# Patient Record
Sex: Female | Born: 1950 | Race: White | Hispanic: No | Marital: Married | Smoking: Never smoker
Health system: Southern US, Community
[De-identification: ages and names within clinical notes are randomized; demographics above are authoritative.]

## PROBLEM LIST (undated history)

## (undated) DIAGNOSIS — E079 Disorder of thyroid, unspecified: Secondary | ICD-10-CM

## (undated) DIAGNOSIS — I10 Essential (primary) hypertension: Secondary | ICD-10-CM

## (undated) HISTORY — PX: ELBOW SURGERY: SHX618

## (undated) HISTORY — PX: CHOLECYSTECTOMY: SHX55

## (undated) HISTORY — PX: ABDOMINAL HYSTERECTOMY: SHX81

---

## 1998-05-23 ENCOUNTER — Ambulatory Visit (HOSPITAL_COMMUNITY): Admission: RE | Admit: 1998-05-23 | Discharge: 1998-05-23 | Payer: Self-pay | Admitting: Obstetrics and Gynecology

## 1998-10-08 ENCOUNTER — Ambulatory Visit (HOSPITAL_COMMUNITY): Admission: RE | Admit: 1998-10-08 | Discharge: 1998-10-08 | Payer: Self-pay | Admitting: Obstetrics and Gynecology

## 1998-10-08 ENCOUNTER — Encounter: Payer: Self-pay | Admitting: Obstetrics and Gynecology

## 1999-06-06 ENCOUNTER — Encounter: Payer: Self-pay | Admitting: Obstetrics and Gynecology

## 1999-06-06 ENCOUNTER — Ambulatory Visit (HOSPITAL_COMMUNITY): Admission: RE | Admit: 1999-06-06 | Discharge: 1999-06-06 | Payer: Self-pay | Admitting: Obstetrics and Gynecology

## 1999-12-02 ENCOUNTER — Other Ambulatory Visit: Admission: RE | Admit: 1999-12-02 | Discharge: 1999-12-02 | Payer: Self-pay | Admitting: Obstetrics and Gynecology

## 2000-06-02 ENCOUNTER — Encounter: Admission: RE | Admit: 2000-06-02 | Discharge: 2000-06-02 | Payer: Self-pay | Admitting: Family Medicine

## 2000-06-02 ENCOUNTER — Encounter: Payer: Self-pay | Admitting: Family Medicine

## 2000-06-07 ENCOUNTER — Encounter: Payer: Self-pay | Admitting: Obstetrics and Gynecology

## 2000-06-07 ENCOUNTER — Ambulatory Visit (HOSPITAL_COMMUNITY): Admission: RE | Admit: 2000-06-07 | Discharge: 2000-06-07 | Payer: Self-pay | Admitting: Obstetrics and Gynecology

## 2000-10-20 ENCOUNTER — Encounter: Payer: Self-pay | Admitting: Family Medicine

## 2000-10-20 ENCOUNTER — Encounter: Admission: RE | Admit: 2000-10-20 | Discharge: 2000-10-20 | Payer: Self-pay | Admitting: Family Medicine

## 2001-07-12 ENCOUNTER — Encounter: Payer: Self-pay | Admitting: Obstetrics and Gynecology

## 2001-07-12 ENCOUNTER — Ambulatory Visit (HOSPITAL_COMMUNITY): Admission: RE | Admit: 2001-07-12 | Discharge: 2001-07-12 | Payer: Self-pay | Admitting: Obstetrics and Gynecology

## 2002-09-08 ENCOUNTER — Ambulatory Visit (HOSPITAL_COMMUNITY): Admission: RE | Admit: 2002-09-08 | Discharge: 2002-09-08 | Payer: Self-pay | Admitting: Obstetrics and Gynecology

## 2002-09-08 ENCOUNTER — Encounter: Payer: Self-pay | Admitting: Obstetrics and Gynecology

## 2003-09-27 ENCOUNTER — Ambulatory Visit (HOSPITAL_COMMUNITY): Admission: RE | Admit: 2003-09-27 | Discharge: 2003-09-27 | Payer: Self-pay | Admitting: Obstetrics and Gynecology

## 2003-09-27 ENCOUNTER — Encounter: Payer: Self-pay | Admitting: Obstetrics and Gynecology

## 2004-01-07 ENCOUNTER — Other Ambulatory Visit: Admission: RE | Admit: 2004-01-07 | Discharge: 2004-01-07 | Payer: Self-pay | Admitting: Obstetrics and Gynecology

## 2004-10-20 ENCOUNTER — Ambulatory Visit (HOSPITAL_COMMUNITY): Admission: RE | Admit: 2004-10-20 | Discharge: 2004-10-20 | Payer: Self-pay | Admitting: Obstetrics and Gynecology

## 2005-11-03 ENCOUNTER — Ambulatory Visit (HOSPITAL_COMMUNITY): Admission: RE | Admit: 2005-11-03 | Discharge: 2005-11-03 | Payer: Self-pay | Admitting: Obstetrics and Gynecology

## 2006-08-03 ENCOUNTER — Emergency Department (HOSPITAL_COMMUNITY): Admission: EM | Admit: 2006-08-03 | Discharge: 2006-08-03 | Payer: Self-pay | Admitting: Emergency Medicine

## 2006-11-09 ENCOUNTER — Ambulatory Visit (HOSPITAL_COMMUNITY): Admission: RE | Admit: 2006-11-09 | Discharge: 2006-11-09 | Payer: Self-pay | Admitting: Obstetrics and Gynecology

## 2007-12-20 ENCOUNTER — Ambulatory Visit (HOSPITAL_COMMUNITY): Admission: RE | Admit: 2007-12-20 | Discharge: 2007-12-20 | Payer: Self-pay | Admitting: Obstetrics and Gynecology

## 2008-12-28 ENCOUNTER — Ambulatory Visit (HOSPITAL_COMMUNITY): Admission: RE | Admit: 2008-12-28 | Discharge: 2008-12-28 | Payer: Self-pay | Admitting: Obstetrics and Gynecology

## 2010-01-07 ENCOUNTER — Ambulatory Visit (HOSPITAL_COMMUNITY): Admission: RE | Admit: 2010-01-07 | Discharge: 2010-01-07 | Payer: Self-pay | Admitting: Obstetrics and Gynecology

## 2010-10-03 ENCOUNTER — Emergency Department (HOSPITAL_COMMUNITY): Admission: EM | Admit: 2010-10-03 | Discharge: 2010-10-03 | Payer: Self-pay | Admitting: Family Medicine

## 2011-01-04 ENCOUNTER — Encounter: Payer: Self-pay | Admitting: Obstetrics and Gynecology

## 2011-03-23 ENCOUNTER — Other Ambulatory Visit (HOSPITAL_COMMUNITY): Payer: Self-pay | Admitting: Obstetrics and Gynecology

## 2011-03-23 DIAGNOSIS — Z1231 Encounter for screening mammogram for malignant neoplasm of breast: Secondary | ICD-10-CM

## 2011-04-01 ENCOUNTER — Ambulatory Visit (HOSPITAL_COMMUNITY)
Admission: RE | Admit: 2011-04-01 | Discharge: 2011-04-01 | Disposition: A | Discharge status: Home / Self Care | Payer: 59 | Source: Ambulatory Visit | Attending: Obstetrics and Gynecology | Admitting: Obstetrics and Gynecology

## 2011-04-01 DIAGNOSIS — Z1231 Encounter for screening mammogram for malignant neoplasm of breast: Secondary | ICD-10-CM | POA: Insufficient documentation

## 2012-04-22 ENCOUNTER — Other Ambulatory Visit (HOSPITAL_COMMUNITY): Payer: Self-pay | Admitting: Obstetrics and Gynecology

## 2012-04-22 DIAGNOSIS — Z1231 Encounter for screening mammogram for malignant neoplasm of breast: Secondary | ICD-10-CM

## 2012-05-12 ENCOUNTER — Encounter: Payer: Self-pay | Admitting: Emergency Medicine

## 2012-05-12 ENCOUNTER — Emergency Department
Admission: EM | Admit: 2012-05-12 | Discharge: 2012-05-12 | Disposition: A | Discharge status: Home / Self Care | Payer: 59 | Source: Home / Self Care

## 2012-05-12 DIAGNOSIS — J309 Allergic rhinitis, unspecified: Secondary | ICD-10-CM

## 2012-05-12 HISTORY — DX: Essential (primary) hypertension: I10

## 2012-05-12 MED ORDER — FLUTICASONE PROPIONATE 50 MCG/ACT NA SUSP: NASAL | Status: AC

## 2012-05-12 NOTE — ED Provider Notes (Signed)
History     CSN: [id number]  Arrival date & time 05/12/12  1050   First MD Initiated Contact with Patient 05/12/12 1053      Chief Complaint  Patient presents with  . Sinus Problem    (Consider location/radiation/quality/duration/timing/severity/associated sxs/prior treatment) HPI Comments: URI Symptoms Onset: 4-5 days  Description: baseline allergic issues over last 2-3 months. Has had rhinorrhea, nasal congestion, post nasal drip, cough  Modifying factors:  See above   Symptoms Nasal discharge: yes Fever: no Sore throat: no Cough: mild Wheezing: no Ear pain: no GI symptoms: no Sick contacts: no  Red Flags  Stiff neck: no Dyspnea: no Rash: no Swallowing difficulty: no  Sinusitis Risk Factors Headache/face pain: mild Double sickening: no tooth pain: no  Allergy Risk Factors Sneezing: yes Itchy scratchy throat: yes Seasonal symptoms: yes  Flu Risk Factors Headache: no muscle aches: no severe fatigue: no     Patient is a 61 y.o. female presenting with sinus complaint. The history is provided by the patient.  Sinus Problem    Past Medical History  Diagnosis Date  . Hypertension     Past Surgical History  Procedure Date  . Cholecystectomy   . Abdominal hysterectomy     No family history on file.  History  Substance Use Topics  . Smoking status: Not on file  . Smokeless tobacco: Not on file  . Alcohol Use: Yes    OB History    Grav Para Term Preterm Abortions TAB SAB Ect Mult Living                  Review of Systems  Allergies  Penicillins and Sulfa antibiotics  Home Medications   Current Outpatient Rx  Name Route Sig Dispense Refill  . ASPIRIN 81 MG PO CHEW Oral Chew 81 mg by mouth daily.    Marland Kitchen ESTROGENS CONJ SYNTHETIC A 0.3 MG PO TABS Oral Take 0.3 mg by mouth daily.    Marland Kitchen MONTELUKAST SODIUM 10 MG PO TABS Oral Take 10 mg by mouth at bedtime.    Marland Kitchen VALSARTAN-HYDROCHLOROTHIAZIDE 320-12.5 MG PO TABS Oral Take 1 tablet by mouth  daily.    Marland Kitchen FLUTICASONE PROPIONATE 50 MCG/ACT NA SUSP  2 sprays into each nostril daily 16 g 2    BP 133/83  Pulse 76  Temp(Src) 98.8 F (37.1 C) (Oral)  Resp 16  Ht 5\' 3"  (1.6 m)  Wt 149 lb (67.586 kg)  BMI 26.39 kg/m2  SpO2 95%  Physical Exam  Constitutional: She appears well-developed and well-nourished.  HENT:  Head: Normocephalic and atraumatic.  Right Ear: External ear normal.  Left Ear: External ear normal.       +nasal erythema, rhinorrhea bilaterally, + post oropharyngeal erythema    Eyes: Conjunctivae are normal. Pupils are equal, round, and reactive to light.  Neck: Normal range of motion. Neck supple.  Cardiovascular: Normal rate and regular rhythm.   Pulmonary/Chest: Effort normal and breath sounds normal. No respiratory distress. She has no wheezes. She has no rales.  Abdominal: Soft. Bowel sounds are normal.  Lymphadenopathy:    She has no cervical adenopathy.  Neurological: She is alert.  Skin: Skin is warm.    ED Course  Procedures (including critical care time)  Labs Reviewed - No data to display No results found.   No diagnosis found.    MDM  Clinically with allergic rhinitis. Will add flonase to regimen of claritin and singulair. Discussed resp red flags. Follow up as needed. Handout  given.     The patient and/or caregiver has been counseled thoroughly with regard to treatment plan and/or medications prescribed including dosage, schedule, interactions, rationale for use, and possible side effects and they verbalize understanding. Diagnoses and expected course of recovery discussed and will return if not improved as expected or if the condition worsens. Patient and/or caregiver verbalized understanding.             Floydene Flock, MD 05/12/12 [phone]

## 2012-05-12 NOTE — ED Provider Notes (Signed)
Pt seen by Dr. Alvester Morin for allergic rhinitis / viral URI.  Marlaine Hind, MD 05/12/12 [phone]

## 2012-05-12 NOTE — ED Notes (Signed)
Congestion, sneezing, cough, pressure, headache x 5 days

## 2012-05-12 NOTE — Discharge Instructions (Signed)
Allergic Rhinitis  Allergic rhinitis is when the mucous membranes in the nose respond to allergens. Allergens are particles in the air that cause your body to have an allergic reaction. This causes you to release allergic antibodies. Through a chain of events, these eventually cause you to release histamine into the blood stream (hence the use of antihistamines). Although meant to be protective to the body, it is this release that causes your discomfort, such as frequent sneezing, congestion and an itchy runny nose.    CAUSES    The pollen allergens may come from grasses, trees, and weeds. This is seasonal allergic rhinitis, or "hay fever." Other allergens cause year-round allergic rhinitis (perennial allergic rhinitis) such as house dust mite allergen, pet dander and mold spores.    SYMPTOMS     Nasal stuffiness (congestion).   Runny, itchy nose with sneezing and tearing of the eyes.   There is often an itching of the mouth, eyes and ears.  It cannot be cured, but it can be controlled with medications.  DIAGNOSIS    If you are unable to determine the offending allergen, skin or blood testing may find it.  TREATMENT     Avoid the allergen.   Medications and allergy shots (immunotherapy) can help.   Hay fever may often be treated with antihistamines in pill or nasal spray forms. Antihistamines block the effects of histamine. There are over-the-counter medicines that may help with nasal congestion and swelling around the eyes. Check with your caregiver before taking or giving this medicine.  If the treatment above does not work, there are many new medications your caregiver can prescribe. Stronger medications may be used if initial measures are ineffective. Desensitizing injections can be used if medications and avoidance fails. Desensitization is when a patient is given ongoing shots until the body becomes less sensitive to the allergen. Make sure you follow up with your caregiver if problems continue.  SEEK  MEDICAL CARE IF:     You develop fever (more than 100.5 F (38.1 C).   You develop a cough that does not stop easily (persistent).   You have shortness of breath.   You start wheezing.   Symptoms interfere with normal daily activities.  Document Released: 08/25/2001 Document Revised: 11/19/2011 Document Reviewed: 03/06/2009  [employer] Patient Information 2012 [employer]

## 2012-05-18 ENCOUNTER — Ambulatory Visit (HOSPITAL_COMMUNITY): Payer: 59

## 2012-06-08 ENCOUNTER — Ambulatory Visit (HOSPITAL_COMMUNITY)
Admission: RE | Admit: 2012-06-08 | Discharge: 2012-06-08 | Disposition: A | Discharge status: Home / Self Care | Payer: 59 | Source: Ambulatory Visit | Attending: Obstetrics and Gynecology | Admitting: Obstetrics and Gynecology

## 2012-06-08 DIAGNOSIS — Z1231 Encounter for screening mammogram for malignant neoplasm of breast: Secondary | ICD-10-CM | POA: Insufficient documentation

## 2012-11-27 ENCOUNTER — Emergency Department (INDEPENDENT_AMBULATORY_CARE_PROVIDER_SITE_OTHER): Payer: 59

## 2012-11-27 ENCOUNTER — Encounter: Payer: Self-pay | Admitting: *Deleted

## 2012-11-27 ENCOUNTER — Emergency Department
Admission: EM | Admit: 2012-11-27 | Discharge: 2012-11-27 | Disposition: A | Discharge status: Home / Self Care | Payer: 59 | Source: Home / Self Care | Attending: Emergency Medicine | Admitting: Emergency Medicine

## 2012-11-27 DIAGNOSIS — M949 Disorder of cartilage, unspecified: Secondary | ICD-10-CM

## 2012-11-27 DIAGNOSIS — M79672 Pain in left foot: Secondary | ICD-10-CM

## 2012-11-27 DIAGNOSIS — M79609 Pain in unspecified limb: Secondary | ICD-10-CM

## 2012-11-27 DIAGNOSIS — M899 Disorder of bone, unspecified: Secondary | ICD-10-CM

## 2012-11-27 NOTE — ED Notes (Signed)
Patient c/o left foot pain started Friday states there is a knot on the bottom.

## 2012-11-27 NOTE — ED Provider Notes (Signed)
History     CSN: [id number]  Arrival date & time 11/27/12  1204   First MD Initiated Contact with Patient 11/27/12 1306      Chief Complaint  Patient presents with  . Foot Pain    (Consider location/radiation/quality/duration/timing/severity/associated sxs/prior treatment) Patient is a 61 y.o. female presenting with lower extremity pain. The history is provided by the patient.  Foot Pain This is a new problem. The current episode started 2 days ago. The problem occurs constantly. The problem has not changed since onset.Pertinent negatives include no chest pain, no abdominal pain, no headaches and no shortness of breath. The symptoms are aggravated by bending (Walking). Relieved by: Has not tried anything. She has tried nothing for the symptoms.   She recalls no trauma. Complains of mid plantar foot pain left foot and left lateral foot for 2 days. 10 out of 10 with activity, 5/10 at rest. It's worst when she gets up from sitting or lying down.  Of note, she said similar right foot pain for the past 2 months, being followed and treated ongoing by her foot specialist, Dr. Jiles Garter here in [place]. Past Medical History  Diagnosis Date  . Hypertension     Past Surgical History  Procedure Date  . Cholecystectomy   . Abdominal hysterectomy     Family History  Problem Relation Age of Onset  . Hyperlipidemia Mother   . Hypertension Mother     History  Substance Use Topics  . Smoking status: Not on file  . Smokeless tobacco: Not on file  . Alcohol Use: Yes    OB History    Grav Para Term Preterm Abortions TAB SAB Ect Mult Living                  Review of Systems  Respiratory: Negative for shortness of breath.   Cardiovascular: Negative for chest pain.  Gastrointestinal: Negative for abdominal pain.  Neurological: Negative for headaches.  All other systems reviewed and are negative.    Allergies  Penicillins and Sulfa antibiotics  Home Medications    Current Outpatient Rx  Name  Route  Sig  Dispense  Refill  . ASPIRIN 81 MG PO CHEW   Oral   Chew 81 mg by mouth daily.         Marland Kitchen ESTROGENS CONJ SYNTHETIC A 0.3 MG PO TABS   Oral   Take 0.3 mg by mouth daily.         Marland Kitchen FLUTICASONE PROPIONATE 50 MCG/ACT NA SUSP      2 sprays into each nostril daily   16 g   2   . MONTELUKAST SODIUM 10 MG PO TABS   Oral   Take 10 mg by mouth at bedtime.         Marland Kitchen VALSARTAN-HYDROCHLOROTHIAZIDE 320-12.5 MG PO TABS   Oral   Take 1 tablet by mouth daily.           BP 135/80  Pulse 85  Temp 97.9 F (36.6 C) (Oral)  Resp 16  Ht 5\' 4"  (1.626 m)  Wt 150 lb 12 oz (68.38 kg)  BMI 25.88 kg/m2  SpO2 94%  Physical Exam  Nursing note and vitals reviewed. Constitutional: She is oriented to person, place, and time. She appears well-developed and well-nourished. No distress.  HENT:  Head: Normocephalic and atraumatic.  Eyes: Conjunctivae normal and EOM are normal. Pupils are equal, round, and reactive to light. No scleral icterus.  Neck: Normal range of motion.  Cardiovascular:  Normal rate.   Pulmonary/Chest: Effort normal.  Abdominal: She exhibits no distension.  Musculoskeletal:       Left foot: She exhibits tenderness (very tender over plantar fascia midfoot and to a lesser extent left heel.) and bony tenderness (Very tender along the fifth metatarsal without deformity). She exhibits normal range of motion, no swelling, normal capillary refill and no laceration.       She declined any other musculoskeletal exam.  Neurological: She is alert and oriented to person, place, and time.  Skin: Skin is warm.  Psychiatric: She has a normal mood and affect.    ED Course  Procedures (including critical care time)  Labs Reviewed - No data to display Dg Foot Complete Left  11/27/2012  *RADIOLOGY REPORT*  Clinical Data: Foot pain left mid foot and fifth metatarsal, no injury  LEFT FOOT - COMPLETE 3+ VIEW  Comparison: None.  Findings: No acute  fracture, malalignment or focal soft tissue swelling.  Incidental note is made of a prominent os peroneum adjacent to the cuboid bone.  There is a thin lucency in this accessory ossicle, however the margins appear well corticated. Normal bony mineralization.  No significant degenerative disease or evidence of inflammatory arthropathy.  IMPRESSION:  1.  No acute fracture or malalignment. 2.  Prominent os peroneum with a well corticated lucency through the distal aspect which may represent a remote injury of this accessory ossicle.   Original Report Authenticated By: Malachy Moan, M.D.      1. Left foot pain       MDM  Left foot pain likely plantar fasciitis. X-ray negative for any acute abnormality. We discussed treatment options. I prescribed ibuprofen 600 mg by mouth every 8 hours with food when necessary pain. #30. No refills. Other nonpharmacologic measures discussed. She declined any Ace bandage or taping or other modality from our office today. I gave her a copy of x-ray report. She'll followup with her foot specialist, has appointment here in 5 days. She declined any other treatment or referral from our office. Questions invited and answered.        Lajean Manes, MD 11/27/12 [phone]

## 2013-01-13 ENCOUNTER — Encounter: Payer: Self-pay | Admitting: *Deleted

## 2013-01-13 ENCOUNTER — Emergency Department
Admission: EM | Admit: 2013-01-13 | Discharge: 2013-01-13 | Disposition: A | Discharge status: Home / Self Care | Payer: 59 | Source: Home / Self Care | Attending: Family Medicine | Admitting: Family Medicine

## 2013-01-13 DIAGNOSIS — J069 Acute upper respiratory infection, unspecified: Secondary | ICD-10-CM

## 2013-01-13 DIAGNOSIS — Z7722 Contact with and (suspected) exposure to environmental tobacco smoke (acute) (chronic): Secondary | ICD-10-CM

## 2013-01-13 DIAGNOSIS — J309 Allergic rhinitis, unspecified: Secondary | ICD-10-CM

## 2013-01-13 DIAGNOSIS — R062 Wheezing: Secondary | ICD-10-CM

## 2013-01-13 MED ORDER — LORATADINE 10 MG PO TABS: 10.0000 mg | ORAL_TABLET | Freq: Every day | ORAL | Status: AC

## 2013-01-13 MED ORDER — AZITHROMYCIN 250 MG PO TABS: ORAL_TABLET | ORAL | Status: DC

## 2013-01-13 MED ORDER — METHYLPREDNISOLONE ACETATE 80 MG/ML IJ SUSP
80.0000 mg | Freq: Once | INTRAMUSCULAR | Status: AC
  Administered 2013-01-13: 80 mg via INTRAMUSCULAR

## 2013-01-13 NOTE — ED Notes (Signed)
Pt c/o cough, nasal congestion and sneezing x 3-4 days. Denies fever. She has taken Robt with no relief.

## 2013-01-13 NOTE — ED Provider Notes (Signed)
History     CSN: [id number]  Arrival date & time 01/13/13  1356   First MD Initiated Contact with Patient 01/13/13 1424      Chief Complaint  Patient presents with  . Cough  . Nasal Congestion   HPI URI Symptoms Onset: 4-5 days  Description: rhinorrhea, nasal congestion, cough, SOB, wheezing  Modifying factors:  Significant secondhand smoke exposure. Pt works at [employer]  Symptoms Nasal discharge: yes Fever: no Sore throat: no Cough: yes Wheezing: yes Ear pain: no GI symptoms: no Sick contacts: yes  Red Flags  Stiff neck: no Dyspnea: no Rash: no Swallowing difficulty: no  Sinusitis Risk Factors Headache/face pain: no Double sickening: no tooth pain: no  Allergy Risk Factors Sneezing: yes Itchy scratchy throat: yes Seasonal symptoms: yes; pt has not been taking medications as prescribed.   Flu Risk Factors Headache: no muscle aches: no severe fatigue: no   Past Medical History  Diagnosis Date  . Hypertension     Past Surgical History  Procedure Date  . Cholecystectomy   . Abdominal hysterectomy     Family History  Problem Relation Age of Onset  . Hyperlipidemia Mother   . Hypertension Mother     History  Substance Use Topics  . Smoking status: Not on file  . Smokeless tobacco: Not on file  . Alcohol Use: Yes    OB History    Grav Para Term Preterm Abortions TAB SAB Ect Mult Living                  Review of Systems  All other systems reviewed and are negative.    Allergies  Penicillins and Sulfa antibiotics  Home Medications   Current Outpatient Rx  Name  Route  Sig  Dispense  Refill  . ASPIRIN 81 MG PO CHEW   Oral   Chew 81 mg by mouth daily.         . AZITHROMYCIN 250 MG PO TABS      Take 2 tabs PO x 1 dose, then 1 tab PO QD x 4 days   6 tablet   0   . ESTROGENS CONJ SYNTHETIC A 0.3 MG PO TABS   Oral   Take 0.3 mg by mouth daily.         Marland Kitchen FLUTICASONE PROPIONATE 50 MCG/ACT NA SUSP      2  sprays into each nostril daily   16 g   2   . LORATADINE 10 MG PO TABS   Oral   Take 1 tablet (10 mg total) by mouth daily.   30 tablet   12   . MONTELUKAST SODIUM 10 MG PO TABS   Oral   Take 10 mg by mouth at bedtime.         Marland Kitchen VALSARTAN-HYDROCHLOROTHIAZIDE 320-12.5 MG PO TABS   Oral   Take 1 tablet by mouth daily.           BP 152/89  Pulse 89  Temp 98.6 F (37 C) (Oral)  Resp 18  Ht 5\' 4"  (1.626 m)  Wt 153 lb (69.4 kg)  BMI 26.26 kg/m2  SpO2 97%  Physical Exam  Constitutional: She appears well-developed and well-nourished.  HENT:  Head: Normocephalic and atraumatic.  Right Ear: External ear normal.  Left Ear: External ear normal.       +nasal erythema, rhinorrhea bilaterally, + post oropharyngeal erythema    Eyes: Conjunctivae normal are normal. Pupils are equal, round, and reactive to  light.  Neck: Normal range of motion. Neck supple.  Cardiovascular: Normal rate and regular rhythm.   Pulmonary/Chest: Effort normal. She has wheezes.  Abdominal: Soft.  Musculoskeletal: Normal range of motion.  Neurological: She is alert.  Skin: Skin is warm.    ED Course  Procedures (including critical care time)  Labs Reviewed - No data to display No results found.   1. URI (upper respiratory infection)   2. Allergic rhinitis   3. Wheezing   4. Secondhand smoke exposure       MDM  depomedrol 80mg  IM x1 Resume allergic rhinitis medications including flonase and claritin. Continue singulair.  Zpak for atypical coverage.  Discussed secondhand smoke avoidance and resp red flags.  Follow up as needed.     The patient and/or caregiver has been counseled thoroughly with regard to treatment plan and/or medications prescribed including dosage, schedule, interactions, rationale for use, and possible side effects and they verbalize understanding. Diagnoses and expected course of recovery discussed and will return if not improved as expected or if the condition  worsens. Patient and/or caregiver verbalized understanding.             Doree Albee, MD 01/13/13 1444

## 2013-01-15 ENCOUNTER — Telehealth: Payer: Self-pay | Admitting: Emergency Medicine

## 2013-03-27 ENCOUNTER — Emergency Department
Admission: EM | Admit: 2013-03-27 | Discharge: 2013-03-27 | Disposition: A | Discharge status: Home / Self Care | Payer: 59 | Source: Home / Self Care | Attending: Family Medicine | Admitting: Family Medicine

## 2013-03-27 ENCOUNTER — Encounter: Payer: Self-pay | Admitting: *Deleted

## 2013-03-27 DIAGNOSIS — J069 Acute upper respiratory infection, unspecified: Secondary | ICD-10-CM

## 2013-03-27 DIAGNOSIS — J309 Allergic rhinitis, unspecified: Secondary | ICD-10-CM

## 2013-03-27 MED ORDER — AZITHROMYCIN 250 MG PO TABS: ORAL_TABLET | ORAL | Status: DC

## 2013-03-27 NOTE — ED Notes (Signed)
Pt c/o cough, HA, nausea, and nasal congestion x 1 day. She has taken Robitussin with some relief.

## 2013-04-26 NOTE — ED Provider Notes (Signed)
History     CSN: [id number]  Arrival date & time 03/27/13  1036   First MD Initiated Contact with Patient 03/27/13 1042      Chief Complaint  Patient presents with  . Nasal Congestion  . Cough  . Headache  . Nausea   HPI  URI Symptoms Onset: 1 day  Description: rhinorrhea, nasal congestion, HA, mild sinus pressure Modifying factors:  Prior hx/o allergies  Symptoms Nasal discharge: yes Fever: no Sore throat: mild Cough: mild Wheezing: no Ear pain: no GI symptoms: no Sick contacts: yes  Red Flags  Stiff neck: no Dyspnea: no Rash: no Swallowing difficulty: no  Sinusitis Risk Factors Headache/face pain: mild Double sickening: no tooth pain: no  Allergy Risk Factors Sneezing: yes Itchy scratchy throat: yes Seasonal symptoms: yes  Flu Risk Factors Headache: no muscle aches: no severe fatigue: no   Past Medical History  Diagnosis Date  . Hypertension     Past Surgical History  Procedure Laterality Date  . Cholecystectomy    . Abdominal hysterectomy    . Elbow surgery      Family History  Problem Relation Age of Onset  . Hyperlipidemia Mother   . Hypertension Mother   . Rheum arthritis Mother     History  Substance Use Topics  . Smoking status: Not on file  . Smokeless tobacco: Not on file  . Alcohol Use: Yes    OB History   Grav Para Term Preterm Abortions TAB SAB Ect Mult Living                  Review of Systems  All other systems reviewed and are negative.    Allergies  Penicillins and Sulfa antibiotics  Home Medications   Current Outpatient Rx  Name  Route  Sig  Dispense  Refill  . aspirin 81 MG chewable tablet   Oral   Chew 81 mg by mouth daily.         Marland Kitchen azithromycin (ZITHROMAX) 250 MG tablet      Take 2 tabs PO x 1 dose, then 1 tab PO QD x 4 days   6 tablet   0   . azithromycin (ZITHROMAX) 250 MG tablet      Take 2 tabs PO x 1 dose, then 1 tab PO QD x 4 days   6 tablet   0   . estrogens conjugated,  synthetic A, (CENESTIN) 0.3 MG tablet   Oral   Take 0.3 mg by mouth daily.         . fluticasone (FLONASE) 50 MCG/ACT nasal spray      2 sprays into each nostril daily   16 g   2   . loratadine (CLARITIN) 10 MG tablet   Oral   Take 1 tablet (10 mg total) by mouth daily.   30 tablet   12   . montelukast (SINGULAIR) 10 MG tablet   Oral   Take 10 mg by mouth at bedtime.         . valsartan-hydrochlorothiazide (DIOVAN-HCT) 320-12.5 MG per tablet   Oral   Take 1 tablet by mouth daily.           BP 132/84  Pulse 100  Temp(Src) 97.9 F (36.6 C) (Oral)  Resp 18  Wt 152 lb (68.947 kg)  BMI 26.08 kg/m2  SpO2 97%  Physical Exam  Constitutional: She appears well-developed and well-nourished.  HENT:  Head: Normocephalic and atraumatic.  Right Ear: External ear normal.  Left Ear: External ear normal.  +nasal erythema, rhinorrhea bilaterally, + post oropharyngeal erythema    Eyes: Conjunctivae are normal. Pupils are equal, round, and reactive to light.  Neck: Normal range of motion.  Cardiovascular: Normal rate, regular rhythm and normal heart sounds.   Pulmonary/Chest: Effort normal and breath sounds normal.  Abdominal: Soft.  Musculoskeletal: Normal range of motion.  Lymphadenopathy:    She has no cervical adenopathy.  Neurological: She is alert.  Skin: Skin is warm.    ED Course  Procedures (including critical care time)  Labs Reviewed - No data to display No results found.   1. URI (upper respiratory infection)   2. Allergic rhinitis       MDM  Overlapping URI and allergic rhinitis.  Resume claritin and flonase.  Continue singulair.  Zpak for atypical coverage if sxs not improved in 5-7 days.  Infectious and resp red flags discussed.     The patient and/or caregiver has been counseled thoroughly with regard to treatment plan and/or medications prescribed including dosage, schedule, interactions, rationale for use, and possible side effects and  they verbalize understanding. Diagnoses and expected course of recovery discussed and will return if not improved as expected or if the condition worsens. Patient and/or caregiver verbalized understanding.              Doree Albee, MD 04/26/13 2034

## 2013-08-03 ENCOUNTER — Other Ambulatory Visit (HOSPITAL_COMMUNITY): Payer: Self-pay | Admitting: Family Medicine

## 2013-08-03 DIAGNOSIS — Z1231 Encounter for screening mammogram for malignant neoplasm of breast: Secondary | ICD-10-CM

## 2013-08-07 ENCOUNTER — Ambulatory Visit (HOSPITAL_BASED_OUTPATIENT_CLINIC_OR_DEPARTMENT_OTHER)
Admission: RE | Admit: 2013-08-07 | Discharge: 2013-08-07 | Disposition: A | Discharge status: Home / Self Care | Payer: 59 | Source: Ambulatory Visit | Attending: Family Medicine | Admitting: Family Medicine

## 2013-08-07 DIAGNOSIS — Z1231 Encounter for screening mammogram for malignant neoplasm of breast: Secondary | ICD-10-CM | POA: Insufficient documentation

## 2014-01-19 ENCOUNTER — Emergency Department
Admission: EM | Admit: 2014-01-19 | Discharge: 2014-01-19 | Disposition: A | Discharge status: Home / Self Care | Payer: 59 | Source: Home / Self Care | Attending: Family Medicine | Admitting: Family Medicine

## 2014-01-19 ENCOUNTER — Encounter: Payer: Self-pay | Admitting: Emergency Medicine

## 2014-01-19 DIAGNOSIS — B029 Zoster without complications: Secondary | ICD-10-CM

## 2014-01-19 MED ORDER — VALACYCLOVIR HCL 1 G PO TABS: 1000.0000 mg | ORAL_TABLET | Freq: Three times a day (TID) | ORAL | Status: AC

## 2014-01-19 NOTE — ED Provider Notes (Signed)
CSN: [id number]     Arrival date & time 01/19/14  1512 History   First MD Initiated Contact with Patient 01/19/14 1542     Chief Complaint  Patient presents with  . Rash      HPI Comments: Patient complains of onset of a mildly pruritic rash on her mid-forehead 3 days ago, followed by extension of the rash onto her left anterior scalp.  She feels well otherwise.  No fevers, chills, and sweats   Patient is a 63 y.o. female presenting with rash. The history is provided by the patient.  Rash Pain location: face and left scalp. Pain quality comment:  Itching Pain severity:  Mild Onset quality:  Sudden Duration:  3 days Timing:  Constant Progression:  Worsening Chronicity:  New Context comment:  Appeared spontaneously Relieved by:  Nothing Worsened by:  Nothing tried Ineffective treatments:  None tried Associated symptoms: no anorexia, no chills, no cough, no diarrhea, no dysuria, no fatigue, no fever, no nausea, no sore throat and no vomiting     Past Medical History  Diagnosis Date  . Hypertension    Past Surgical History  Procedure Laterality Date  . Cholecystectomy    . Abdominal hysterectomy    . Elbow surgery     Family History  Problem Relation Age of Onset  . Hyperlipidemia Mother   . Hypertension Mother   . Rheum arthritis Mother    History  Substance Use Topics  . Smoking status: Never Smoker   . Smokeless tobacco: Not on file  . Alcohol Use: Yes   OB History   Grav Para Term Preterm Abortions TAB SAB Ect Mult Living                 Review of Systems  Constitutional: Negative for fever, chills and fatigue.  HENT: Negative for sore throat.   Respiratory: Negative for cough.   Gastrointestinal: Negative for nausea, vomiting, diarrhea and anorexia.  Genitourinary: Negative for dysuria.  Skin: Positive for rash.  All other systems reviewed and are negative.    Allergies  Penicillins and Sulfa antibiotics  Home Medications   Current Outpatient Rx    Name  Route  Sig  Dispense  Refill  . aspirin 81 MG chewable tablet   Oral   Chew 81 mg by mouth daily.         Marland Kitchen azithromycin (ZITHROMAX) 250 MG tablet      Take 2 tabs PO x 1 dose, then 1 tab PO QD x 4 days   6 tablet   0   . azithromycin (ZITHROMAX) 250 MG tablet      Take 2 tabs PO x 1 dose, then 1 tab PO QD x 4 days   6 tablet   0   . estrogens conjugated, synthetic A, (CENESTIN) 0.3 MG tablet   Oral   Take 0.3 mg by mouth daily.         . fluticasone (FLONASE) 50 MCG/ACT nasal spray      2 sprays into each nostril daily   16 g   2   . loratadine (CLARITIN) 10 MG tablet   Oral   Take 1 tablet (10 mg total) by mouth daily.   30 tablet   12   . montelukast (SINGULAIR) 10 MG tablet   Oral   Take 10 mg by mouth at bedtime.         . valACYclovir (VALTREX) 1000 MG tablet   Oral   Take 1 tablet (  1,000 mg total) by mouth 3 (three) times daily.   21 tablet   0   . valsartan-hydrochlorothiazide (DIOVAN-HCT) 320-12.5 MG per tablet   Oral   Take 1 tablet by mouth daily.          BP 151/95  Pulse 89  Temp(Src) 97.9 F (36.6 C) (Oral)  Resp 16  Wt 157 lb (71.215 kg)  SpO2 98% Physical Exam  Nursing note and vitals reviewed. Constitutional: She is oriented to person, place, and time. She appears well-developed and well-nourished. No distress.  HENT:  Head: Normocephalic.    Right Ear: External ear normal.  Left Ear: External ear normal.  Nose: Nose normal.  Mouth/Throat: Oropharynx is clear and moist.  Patient's upper mid-line scalp has an erythematous herpetiform eruption as noted on diagram.  Eyes: Conjunctivae and EOM are normal. Pupils are equal, round, and reactive to light.  Neck: Neck supple.  Cardiovascular: Normal heart sounds.   Pulmonary/Chest: Breath sounds normal.  Lymphadenopathy:    She has no cervical adenopathy.  Neurological: She is alert and oriented to person, place, and time.  Skin: Skin is warm and dry.    ED Course   Procedures  None       MDM   1. Herpes zoster    Begin Valtrex. Followup with Family Doctor if not improved in one week.     Lattie HawStephen A Josiah Nieto, MD 01/22/14 [phone]

## 2014-01-19 NOTE — ED Notes (Signed)
Pt c/o rash on her face and scalp x 5 days. She reports that the rash itches and is sore, but no burning.

## 2014-01-19 NOTE — Discharge Instructions (Signed)
Shingles Shingles (herpes zoster) is an infection that is caused by the same virus that causes chickenpox (varicella). The infection causes a painful skin rash and fluid-filled blisters, which eventually break open, crust over, and heal. It may occur in any area of the body, but it usually affects only one side of the body or face. The pain of shingles usually lasts about 1 month. However, some people with shingles may develop long-term (chronic) pain in the affected area of the body. Shingles often occurs many years after the person had chickenpox. It is more common:  In people older than 50 years.  In people with weakened immune systems, such as those with HIV, AIDS, or cancer.  In people taking medicines that weaken the immune system, such as transplant medicines.  In people under great stress. CAUSES  Shingles is caused by the varicella zoster virus (VZV), which also causes chickenpox. After a person is infected with the virus, it can remain in the person's body for years in an inactive state (dormant). To cause shingles, the virus reactivates and breaks out as an infection in a nerve root. The virus can be spread from person to person (contagious) through contact with open blisters of the shingles rash. It will only spread to people who have not had chickenpox. When these people are exposed to the virus, they may develop chickenpox. They will not develop shingles. Once the blisters scab over, the person is no longer contagious and cannot spread the virus to others. SYMPTOMS  Shingles shows up in stages. The initial symptoms may be pain, itching, and tingling in an area of the skin. This pain is usually described as burning, stabbing, or throbbing.In a few days or weeks, a painful red rash will appear in the area where the pain, itching, and tingling were felt. The rash is usually on one side of the body in a band or belt-like pattern. Then, the rash usually turns into fluid-filled blisters. They  will scab over and dry up in approximately 2 3 weeks. Flu-like symptoms may also occur with the initial symptoms, the rash, or the blisters. These may include:  Fever.  Chills.  Headache.  Upset stomach. DIAGNOSIS  Your caregiver will perform a skin exam to diagnose shingles. Skin scrapings or fluid samples may also be taken from the blisters. This sample will be examined under a microscope or sent to a lab for further testing. TREATMENT  There is no specific cure for shingles. Your caregiver will likely prescribe medicines to help you manage the pain, recover faster, and avoid long-term problems. This may include antiviral drugs, anti-inflammatory drugs, and pain medicines. HOME CARE INSTRUCTIONS   Take a cool bath or apply cool compresses to the area of the rash or blisters as directed. This may help with the pain and itching.   Only take over-the-counter or prescription medicines as directed by your caregiver.   Rest as directed by your caregiver.  Keep your rash and blisters clean with mild soap and cool water or as directed by your caregiver.  Do not pick your blisters or scratch your rash. Apply an anti-itch cream or numbing creams to the affected area as directed by your caregiver.  Keep your shingles rash covered with a loose bandage (dressing).  Avoid skin contact with:  Babies.   Pregnant women.   Children with eczema.   Elderly people with transplants.   People with chronic illnesses, such as leukemia or AIDS.   Wear loose-fitting clothing to help ease   the pain of material rubbing against the rash.  Keep all follow-up appointments with your caregiver.If the area involved is on your face, you may receive a referral for follow-up to a specialist, such as an eye doctor (ophthalmologist) or an ear, nose, and throat (ENT) doctor. Keeping all follow-up appointments will help you avoid eye complications, chronic pain, or disability.  SEEK IMMEDIATE MEDICAL  CARE IF:   You have facial pain, pain around the eye area, or loss of feeling on one side of your face.  You have ear pain or ringing in your ear.  You have loss of taste.  Your pain is not relieved with prescribed medicines.   Your redness or swelling spreads.   You have more pain and swelling.  Your condition is worsening or has changed.   You have a feveror persistent symptoms for more than 2 3 days.  You have a fever and your symptoms suddenly get worse. MAKE SURE YOU:  Understand these instructions.  Will watch your condition.  Will get help right away if you are not doing well or get worse. Document Released: 11/30/2005 Document Revised: 08/24/2012 Document Reviewed: 07/14/2012 [employer] Patient Information 2014 [employer].  

## 2014-05-31 ENCOUNTER — Encounter: Payer: Self-pay | Admitting: Emergency Medicine

## 2014-05-31 ENCOUNTER — Emergency Department
Admission: EM | Admit: 2014-05-31 | Discharge: 2014-05-31 | Disposition: A | Discharge status: Home / Self Care | Payer: 59 | Source: Home / Self Care | Attending: Emergency Medicine | Admitting: Emergency Medicine

## 2014-05-31 DIAGNOSIS — R197 Diarrhea, unspecified: Secondary | ICD-10-CM

## 2014-05-31 MED ORDER — CIPROFLOXACIN HCL 500 MG PO TABS: 500.0000 mg | ORAL_TABLET | Freq: Two times a day (BID) | ORAL | Status: DC

## 2014-05-31 NOTE — ED Provider Notes (Signed)
CSN: [id number]     Arrival date & time 05/31/14  1510 History   First MD Initiated Contact with Patient 05/31/14 1553     Chief Complaint  Patient presents with  . Diarrhea   (Consider location/radiation/quality/duration/timing/severity/associated sxs/prior Treatment) HPI Complains of 4 days of frequent loose watery stools without blood or mucus. The first few days, had at least 10 diarrheal stools per day, now decreasing, but still 5 loose stools in the past 24 hours. Associated with abdominal "grumbling" but denies abdominal pain. Had some nausea that's decreasing and she is tolerating by mouth clear liquids. No vomiting. Denies fever or chills. No one else is sick at home. Denies foreign travel. Drinks city water. No other associated symptoms. She tried Imodium and that helped a little bit.2 Past Medical History  Diagnosis Date  . Hypertension    Past Surgical History  Procedure Laterality Date  . Cholecystectomy    . Abdominal hysterectomy    . Elbow surgery     Family History  Problem Relation Age of Onset  . Hyperlipidemia Mother   . Hypertension Mother   . Rheum arthritis Mother    History  Substance Use Topics  . Smoking status: Never Smoker   . Smokeless tobacco: Not on file  . Alcohol Use: Yes   OB History   Grav Para Term Preterm Abortions TAB SAB Ect Mult Living                 Review of Systems  All other systems reviewed and are negative.   Allergies  Penicillins and Sulfa antibiotics  Home Medications   Prior to Admission medications   Medication Sig Start Date End Date Taking? Authorizing Provider  aspirin 81 MG chewable tablet Chew 81 mg by mouth daily.    Historical Provider, MD  azithromycin (ZITHROMAX) 250 MG tablet Take 2 tabs PO x 1 dose, then 1 tab PO QD x 4 days 01/13/13   Doree AlbeeSteven Newton, MD  azithromycin (ZITHROMAX) 250 MG tablet Take 2 tabs PO x 1 dose, then 1 tab PO QD x 4 days 03/27/13   Doree AlbeeSteven Newton, MD  ciprofloxacin (CIPRO) 500 MG  tablet Take 1 tablet (500 mg total) by mouth 2 (two) times daily. For 7 days 05/31/14   Lajean Manesavid Massey, MD  estrogens conjugated, synthetic A, (CENESTIN) 0.3 MG tablet Take 0.3 mg by mouth daily.    Historical Provider, MD  fluticasone Aleda Grana(FLONASE) 50 MCG/ACT nasal spray 2 sprays into each nostril daily 05/12/12   Doree AlbeeSteven Newton, MD  loratadine (CLARITIN) 10 MG tablet Take 1 tablet (10 mg total) by mouth daily. 01/13/13   Doree AlbeeSteven Newton, MD  montelukast (SINGULAIR) 10 MG tablet Take 10 mg by mouth at bedtime.    Historical Provider, MD  valACYclovir (VALTREX) 1000 MG tablet Take 1 tablet (1,000 mg total) by mouth 3 (three) times daily. 01/19/14   Lattie HawStephen A Beese, MD  valsartan-hydrochlorothiazide (DIOVAN-HCT) 320-12.5 MG per tablet Take 1 tablet by mouth daily.    Historical Provider, MD   BP 124/80  Pulse 81  Temp(Src) 98.2 F (36.8 C) (Oral)  Ht 5\' 4"  (1.626 m)  Wt 157 lb (71.215 kg)  BMI 26.94 kg/m2  SpO2 96% Physical Exam  Nursing note and vitals reviewed. Constitutional: She is oriented to person, place, and time. She appears well-developed and well-nourished.  Non-toxic appearance. No distress.  Appears fatigued, but no acute distress. Pleasant, cooperative.  HENT:  Head: Normocephalic and atraumatic.  Nose: Nose normal.  Mouth/Throat: Oropharynx  is clear and moist.  Oropharynx clear. Some moisture on mucous membranes. No lesions.  Eyes: Pupils are equal, round, and reactive to light. No scleral icterus.  Neck: Normal range of motion. Neck supple. No JVD present.  Cardiovascular: Normal rate, regular rhythm and normal heart sounds.   No murmur heard. Pulmonary/Chest: Effort normal and breath sounds normal.  Abdominal: Soft. She exhibits no distension, no abdominal bruit and no mass. Bowel sounds are increased. There is no hepatosplenomegaly. There is no tenderness. There is no rebound, no guarding and no CVA tenderness.  Musculoskeletal: Normal range of motion. She exhibits no edema and no  tenderness.  Lymphadenopathy:    She has no cervical adenopathy.  Neurological: She is alert and oriented to person, place, and time.  Skin: No rash noted.  Psychiatric: She has a normal mood and affect.    ED Course  Procedures (including critical care time) Labs Review Labs Reviewed - No data to display  Imaging Review No results found.   MDM   1. Diarrhea of presumed infectious origin    Treatment options discussed, as well as risks, benefits, alternatives. Patient voiced understanding and agreement with the following plans:  Cipro 500 twice a day x7 days She declined anti-spasmodic such as Levsin Hold off on Imodium unless absolutely needed Push clear liquids, advance to bland diet. Avoid dairy for next week Other advice given Over 25 minutes spent, greater than 50% of the time spent for counseling and coordination of care. She declined any testing. Follow-up with your primary care doctor in 5-7 days if not improving, or sooner if symptoms become worse. Precautions discussed. Red flags discussed. Questions invited and answered. Patient voiced understanding and agreement.   Lajean Manesavid Massey, MD 05/31/14 [phone]

## 2014-05-31 NOTE — ED Notes (Signed)
Diarrhea x 4 days getting better, nausea started today, dull headache

## 2014-08-24 ENCOUNTER — Other Ambulatory Visit: Payer: Self-pay | Admitting: Family Medicine

## 2014-08-24 DIAGNOSIS — Z1231 Encounter for screening mammogram for malignant neoplasm of breast: Secondary | ICD-10-CM

## 2014-08-28 ENCOUNTER — Ambulatory Visit (INDEPENDENT_AMBULATORY_CARE_PROVIDER_SITE_OTHER): Payer: 59

## 2014-08-28 DIAGNOSIS — Z1231 Encounter for screening mammogram for malignant neoplasm of breast: Secondary | ICD-10-CM

## 2015-09-11 ENCOUNTER — Other Ambulatory Visit: Payer: Self-pay | Admitting: Obstetrics and Gynecology

## 2015-09-11 DIAGNOSIS — Z1231 Encounter for screening mammogram for malignant neoplasm of breast: Secondary | ICD-10-CM

## 2015-09-18 ENCOUNTER — Ambulatory Visit: Payer: Self-pay

## 2015-09-25 ENCOUNTER — Ambulatory Visit: Payer: Self-pay

## 2015-10-10 ENCOUNTER — Ambulatory Visit (INDEPENDENT_AMBULATORY_CARE_PROVIDER_SITE_OTHER): Payer: Commercial Managed Care - HMO

## 2015-10-10 DIAGNOSIS — Z1231 Encounter for screening mammogram for malignant neoplasm of breast: Secondary | ICD-10-CM

## 2016-10-29 ENCOUNTER — Other Ambulatory Visit: Payer: Self-pay | Admitting: Family Medicine

## 2016-10-29 DIAGNOSIS — Z1231 Encounter for screening mammogram for malignant neoplasm of breast: Secondary | ICD-10-CM

## 2016-10-30 ENCOUNTER — Ambulatory Visit (INDEPENDENT_AMBULATORY_CARE_PROVIDER_SITE_OTHER): Payer: Medicare Other

## 2016-10-30 DIAGNOSIS — Z1231 Encounter for screening mammogram for malignant neoplasm of breast: Secondary | ICD-10-CM

## 2017-11-10 ENCOUNTER — Other Ambulatory Visit: Payer: Self-pay | Admitting: Obstetrics and Gynecology

## 2017-11-10 DIAGNOSIS — Z1231 Encounter for screening mammogram for malignant neoplasm of breast: Secondary | ICD-10-CM

## 2017-11-12 ENCOUNTER — Ambulatory Visit (INDEPENDENT_AMBULATORY_CARE_PROVIDER_SITE_OTHER): Payer: Medicare Other

## 2017-11-12 DIAGNOSIS — Z1231 Encounter for screening mammogram for malignant neoplasm of breast: Secondary | ICD-10-CM | POA: Diagnosis not present

## 2017-12-24 ENCOUNTER — Encounter: Payer: Self-pay | Admitting: *Deleted

## 2017-12-24 ENCOUNTER — Emergency Department
Admission: EM | Admit: 2017-12-24 | Discharge: 2017-12-24 | Disposition: A | Discharge status: Home / Self Care | Payer: Medicare Other | Source: Home / Self Care | Attending: Family Medicine | Admitting: Family Medicine

## 2017-12-24 ENCOUNTER — Other Ambulatory Visit: Payer: Self-pay

## 2017-12-24 DIAGNOSIS — J069 Acute upper respiratory infection, unspecified: Secondary | ICD-10-CM | POA: Diagnosis not present

## 2017-12-24 DIAGNOSIS — H65191 Other acute nonsuppurative otitis media, right ear: Secondary | ICD-10-CM

## 2017-12-24 DIAGNOSIS — H9201 Otalgia, right ear: Secondary | ICD-10-CM

## 2017-12-24 MED ORDER — BENZONATATE 100 MG PO CAPS: 100.0000 mg | ORAL_CAPSULE | Freq: Three times a day (TID) | ORAL | 0 refills | Status: DC

## 2017-12-24 MED ORDER — AZITHROMYCIN 250 MG PO TABS: 250.0000 mg | ORAL_TABLET | Freq: Every day | ORAL | 0 refills | Status: DC

## 2017-12-24 NOTE — ED Triage Notes (Signed)
Pt c/o nasal congestion, bilateral ear pain, HA and dry throat x 1 wk. Denies fever. She is taking OTC "sinus pill" without relief.

## 2017-12-24 NOTE — ED Provider Notes (Signed)
[facility]    CSN: [id number] Arrival date & time: 12/24/17  0858     History   Chief Complaint Chief Complaint  Patient presents with  . Nasal Congestion    HPI Lauren Sandoval is a 67 y.o. female.   HPI  Lauren Sandoval is a 67 y.o. female presenting to UC with c/o worsening sinus pressure with nasal congestion, bilateral ear pain with Right worse than left, sore throat, and cough.  She takes twice daily "sinus pills" but has also been taking an OTC cough medication w/o relief.  She also notes she has been under a lot of stress this week with her step-father dying earlier in the week and her ex-husband drying yesterday.  She also notes her current husband had had cold-like symptoms all week.  Denies fever, chills, n/v/d.    Past Medical History:  Diagnosis Date  . Hypertension     There are no active problems to display for this patient.   Past Surgical History:  Procedure Laterality Date  . ABDOMINAL HYSTERECTOMY    . CHOLECYSTECTOMY    . ELBOW SURGERY      OB History    No data available       Home Medications    Prior to Admission medications   Medication Sig Start Date End Date Taking? Authorizing Provider  aspirin 81 MG chewable tablet Chew 81 mg by mouth daily.    [provider]  azithromycin (ZITHROMAX) 250 MG tablet Take 1 tablet (250 mg total) by mouth daily. Take first 2 tablets together, then 1 every day until finished. 12/24/17   Lurene Shadow, PA-C  benzonatate (TESSALON) 100 MG capsule Take 1-2 capsules (100-200 mg total) by mouth every 8 (eight) hours. 12/24/17   Lurene Shadow, PA-C  estrogens conjugated, synthetic A, (CENESTIN) 0.3 MG tablet Take 0.3 mg by mouth daily.    [provider]  fluticasone Aleda Grana) 50 MCG/ACT nasal spray 2 sprays into each nostril daily 05/12/12   Floydene Flock, MD  loratadine (CLARITIN) 10 MG tablet Take 1 tablet (10 mg total) by mouth daily. 01/13/13   Floydene Flock, MD    montelukast (SINGULAIR) 10 MG tablet Take 10 mg by mouth at bedtime.    [provider]  valACYclovir (VALTREX) 1000 MG tablet Take 1 tablet (1,000 mg total) by mouth 3 (three) times daily. 01/19/14   Lattie Haw, MD  valsartan-hydrochlorothiazide (DIOVAN-HCT) 320-12.5 MG per tablet Take 1 tablet by mouth daily.    [provider]    Family History Family History  Problem Relation Age of Onset  . Hyperlipidemia Mother   . Hypertension Mother   . Rheum arthritis Mother     Social History Social History   Tobacco Use  . Smoking status: Never Smoker  . Smokeless tobacco: Never Used  Substance Use Topics  . Alcohol use: Yes  . Drug use: No     Allergies   Penicillins and Sulfa antibiotics   Review of Systems Review of Systems  Constitutional: Positive for fatigue. Negative for chills and fever.  HENT: Positive for congestion, ear pain, postnasal drip, sinus pressure and sore throat. Negative for trouble swallowing and voice change.   Respiratory: Positive for cough. Negative for shortness of breath.   Cardiovascular: Negative for chest pain and palpitations.  Gastrointestinal: Negative for abdominal pain, diarrhea, nausea and vomiting.  Musculoskeletal: Negative for arthralgias, back pain and myalgias.  Skin: Negative for rash.  Neurological: Positive  for headaches. Negative for dizziness and light-headedness.     Physical Exam Triage Vital Signs ED Triage Vitals  Enc Vitals Group     BP 12/24/17 0934 (!) 158/98     Pulse Rate 12/24/17 0934 95     Resp 12/24/17 0934 16     Temp 12/24/17 0934 98.4 F (36.9 C)     Temp Source 12/24/17 0934 Oral     SpO2 12/24/17 0934 96 %     Weight 12/24/17 0934 164 lb (74.4 kg)     Height --      Head Circumference --      Peak Flow --      Pain Score 12/24/17 0935 0     Pain Loc --      Pain Edu? --      Excl. in [place]? --    No data found.  Updated Vital Signs BP (!) 158/98 (BP Location: Right Arm)    Pulse 95   Temp 98.4 F (36.9 C) (Oral)   Resp 16   Wt 164 lb (74.4 kg)   SpO2 96%   BMI 28.15 kg/m   Visual Acuity Right Eye Distance:   Left Eye Distance:   Bilateral Distance:    Right Eye Near:   Left Eye Near:    Bilateral Near:     Physical Exam  Constitutional: She is oriented to person, place, and time. She appears well-developed and well-nourished. No distress.  HENT:  Head: Normocephalic and atraumatic.  Right Ear: Tympanic membrane is not erythematous and not bulging. A middle ear effusion is present.  Left Ear: Tympanic membrane normal.  Nose: Mucosal edema present. Right sinus exhibits no maxillary sinus tenderness and no frontal sinus tenderness. Left sinus exhibits no maxillary sinus tenderness and no frontal sinus tenderness.  Mouth/Throat: Uvula is midline and mucous membranes are normal. Posterior oropharyngeal erythema present. No oropharyngeal exudate, posterior oropharyngeal edema or tonsillar abscesses.  Eyes: EOM are normal.  Neck: Normal range of motion. Neck supple.  Cardiovascular: Normal rate and regular rhythm.  Pulmonary/Chest: Effort normal and breath sounds normal. No stridor. No respiratory distress. She has no wheezes. She has no rales.  Musculoskeletal: Normal range of motion.  Lymphadenopathy:    She has no cervical adenopathy.  Neurological: She is alert and oriented to person, place, and time.  Skin: Skin is warm and dry. She is not diaphoretic.  Psychiatric: She has a normal mood and affect. Her behavior is normal.  Nursing note and vitals reviewed.    UC Treatments / Results  Labs (all labs ordered are listed, but only abnormal results are displayed) Labs Reviewed - No data to display  EKG  EKG Interpretation None       Radiology No results found.  Procedures Procedures (including critical care time)  Medications Ordered in UC Medications - No data to display   Initial Impression / Assessment and Plan / UC Course  I  have reviewed the triage vital signs and the nursing notes.  Pertinent labs & imaging results that were available during my care of the patient were reviewed by me and considered in my medical decision making (see chart for details).     Potential early Right AOM due to URI Will start on Azithromycin as pt is allergic to PCN and sulfa drugs Encouraged fluids and rest F/u with PCP next week if needed.   Final Clinical Impressions(s) / UC Diagnoses   Final diagnoses:  Upper respiratory tract infection, unspecified type  Acute middle ear effusion, right  Right ear pain    ED Discharge Orders        Ordered    benzonatate (TESSALON) 100 MG capsule  Every 8 hours     12/24/17 0942    azithromycin (ZITHROMAX) 250 MG tablet  Daily     12/24/17 0942       Controlled Substance Prescriptions [place] Controlled Substance Registry consulted? Not Applicable   Rolla Platehelps, Norman Piacentini O, PA-C 12/24/17 [id number]

## 2018-11-18 ENCOUNTER — Other Ambulatory Visit: Payer: Self-pay | Admitting: Family Medicine

## 2018-11-18 DIAGNOSIS — Z1231 Encounter for screening mammogram for malignant neoplasm of breast: Secondary | ICD-10-CM

## 2018-11-23 ENCOUNTER — Ambulatory Visit (INDEPENDENT_AMBULATORY_CARE_PROVIDER_SITE_OTHER): Payer: Medicare Other

## 2018-11-23 DIAGNOSIS — Z1231 Encounter for screening mammogram for malignant neoplasm of breast: Secondary | ICD-10-CM

## 2019-11-03 ENCOUNTER — Other Ambulatory Visit: Payer: Self-pay | Admitting: Family Medicine

## 2019-11-03 DIAGNOSIS — Z1231 Encounter for screening mammogram for malignant neoplasm of breast: Secondary | ICD-10-CM

## 2019-11-30 ENCOUNTER — Ambulatory Visit (INDEPENDENT_AMBULATORY_CARE_PROVIDER_SITE_OTHER): Payer: Medicare Other

## 2019-11-30 ENCOUNTER — Other Ambulatory Visit: Payer: Self-pay

## 2019-11-30 DIAGNOSIS — Z1231 Encounter for screening mammogram for malignant neoplasm of breast: Secondary | ICD-10-CM | POA: Diagnosis not present

## 2019-12-11 ENCOUNTER — Encounter: Payer: Self-pay | Admitting: Family Medicine

## 2019-12-11 ENCOUNTER — Other Ambulatory Visit: Payer: Self-pay

## 2019-12-11 ENCOUNTER — Emergency Department
Admission: EM | Admit: 2019-12-11 | Discharge: 2019-12-11 | Disposition: A | Discharge status: Home / Self Care | Payer: Medicare Other | Source: Home / Self Care

## 2019-12-11 DIAGNOSIS — J0101 Acute recurrent maxillary sinusitis: Secondary | ICD-10-CM

## 2019-12-11 MED ORDER — AZITHROMYCIN 250 MG PO TABS: ORAL_TABLET | ORAL | 0 refills | Status: AC

## 2019-12-11 NOTE — ED Triage Notes (Signed)
Pt feels that she has a sinus infection, went to [place] on Friday, and came home sick.  Nasal congestion, facial pain, and ear pressure

## 2019-12-11 NOTE — Discharge Instructions (Signed)
Vitamin D3 5000 IU (125 mg) daily °Vitamin C 500 mg twice daily °Zinc 50 to 75 mg daily ° °Listerine type mouthwash 4 times a day ° °

## 2019-12-11 NOTE — ED Provider Notes (Signed)
[facility]    CSN: [id number] Arrival date & time: 12/11/19  0917       History   Chief Complaint Chief Complaint  Patient presents with  . Nasal Congestion  . Facial Pain    HPI Lauren Sandoval is a 68 y.o. female.   This is a 68 year old [facility] urgent care patient presenting with possible sinus infection.  Pt feels that she has a sinus infection, went to [place] on Friday, and came home sick.  Nasal congestion, facial pain, and ear pressure.  No one she has seen recently had Covid.  She has a h/o sinus infections.  These began 3 days ago.       Past Medical History:  Diagnosis Date  . Hypertension     There are no problems to display for this patient.   Past Surgical History:  Procedure Laterality Date  . ABDOMINAL HYSTERECTOMY    . CHOLECYSTECTOMY    . ELBOW SURGERY      OB History   No obstetric history on file.      Home Medications    Prior to Admission medications   Medication Sig Start Date End Date Taking? Authorizing Provider  estradiol (ESTRACE) 0.5 MG tablet estradiol 0.5 mg tablet  TAKE 1 TABLET BY MOUTH EVERY DAY 07/30/17  Yes [provider]  aspirin 81 MG chewable tablet Chew 81 mg by mouth daily.    [provider]  azithromycin (ZITHROMAX) 250 MG tablet Take 2 tabs PO x 1 dose, then 1 tab PO QD x 4 days 12/11/19   Elvina Sidle, MD  estrogens conjugated, synthetic A, (CENESTIN) 0.3 MG tablet Take 0.3 mg by mouth daily.    [provider]  fluticasone Aleda Grana) 50 MCG/ACT nasal spray 2 sprays into each nostril daily 05/12/12   Floydene Flock, MD  levothyroxine (SYNTHROID) 88 MCG tablet Take 88 mcg by mouth daily. 10/27/19   [provider]  loratadine (CLARITIN) 10 MG tablet Take 1 tablet (10 mg total) by mouth daily. 01/13/13   Floydene Flock, MD  montelukast (SINGULAIR) 10 MG tablet Take 10 mg by mouth at bedtime.    [provider]  valACYclovir (VALTREX) 1000 MG  tablet Take 1 tablet (1,000 mg total) by mouth 3 (three) times daily. 01/19/14   Lattie Haw, MD  valsartan-hydrochlorothiazide (DIOVAN-HCT) 320-12.5 MG per tablet Take 1 tablet by mouth daily.    [provider]    Family History Family History  Problem Relation Age of Onset  . Hyperlipidemia Mother   . Hypertension Mother   . Rheum arthritis Mother     Social History Social History   Tobacco Use  . Smoking status: Never Smoker  . Smokeless tobacco: Never Used  Substance Use Topics  . Alcohol use: Yes  . Drug use: No     Allergies   Penicillins and Sulfa antibiotics   Review of Systems Review of Systems  Constitutional: Negative.   HENT: Positive for congestion, sinus pressure and sinus pain.   Eyes: Negative.   Respiratory: Negative.   Cardiovascular: Negative.   Gastrointestinal: Negative.      Physical Exam Triage Vital Signs ED Triage Vitals  Enc Vitals Group     BP      Pulse      Resp      Temp      Temp src      SpO2      Weight      Height  Head Circumference      Peak Flow      Pain Score      Pain Loc      Pain Edu?      Excl. in [place]?    No data found.  Updated Vital Signs BP 126/78 (BP Location: Right Arm)   Pulse 92   Temp 98.5 F (36.9 C) (Oral)   Resp 20   Ht 5\' 3"  (1.6 m)   Wt 74.4 kg   SpO2 97%   BMI 29.05 kg/m    Physical Exam Vitals and nursing note reviewed.  Constitutional:      General: She is not in acute distress.    Appearance: Normal appearance. She is normal weight. She is not toxic-appearing.  HENT:     Head: Normocephalic.     Right Ear: Tympanic membrane, ear canal and external ear normal.     Left Ear: Tympanic membrane, ear canal and external ear normal.     Nose: Congestion present.     Mouth/Throat:     Mouth: Mucous membranes are moist.     Pharynx: No oropharyngeal exudate or posterior oropharyngeal erythema.  Eyes:     Conjunctiva/sclera: Conjunctivae normal.  Cardiovascular:      Rate and Rhythm: Normal rate.  Pulmonary:     Effort: Pulmonary effort is normal.     Breath sounds: Normal breath sounds.  Musculoskeletal:        General: Normal range of motion.     Cervical back: Normal range of motion and neck supple. No tenderness.  Lymphadenopathy:     Cervical: No cervical adenopathy.  Skin:    General: Skin is warm and dry.  Neurological:     General: No focal deficit present.     Mental Status: She is alert and oriented to person, place, and time.  Psychiatric:        Mood and Affect: Mood normal.      UC Treatments / Results  Labs (all labs ordered are listed, but only abnormal results are displayed) Labs Reviewed - No data to display  EKG   Radiology No results found.  Procedures Procedures (including critical care time)  Medications Ordered in UC Medications - No data to display  Initial Impression / Assessment and Plan / UC Course  I have reviewed the triage vital signs and the nursing notes.  Pertinent labs & imaging results that were available during my care of the patient were reviewed by me and considered in my medical decision making (see chart for details).    Final Clinical Impressions(s) / UC Diagnoses   Final diagnoses:  Acute recurrent maxillary sinusitis     Discharge Instructions     Vitamin D3 5000 IU (125 mg) daily Vitamin C 500 mg twice daily Zinc 50 to 75 mg daily  Listerine type mouthwash 4 times a day     ED Prescriptions    Medication Sig Dispense Auth. Provider   azithromycin (ZITHROMAX) 250 MG tablet Take 2 tabs PO x 1 dose, then 1 tab PO QD x 4 days 6 tablet Robyn Haber, MD     I have reviewed the PDMP during this encounter.   Robyn Haber, MD 12/11/19 1020

## 2020-11-22 ENCOUNTER — Other Ambulatory Visit: Payer: Self-pay | Admitting: Family Medicine

## 2020-11-22 DIAGNOSIS — Z1231 Encounter for screening mammogram for malignant neoplasm of breast: Secondary | ICD-10-CM

## 2020-12-12 ENCOUNTER — Emergency Department: Admission: EM | Admit: 2020-12-12 | Discharge: 2020-12-12 | Discharge status: Absent without leave | Payer: Self-pay

## 2020-12-12 ENCOUNTER — Emergency Department
Admission: EM | Admit: 2020-12-12 | Discharge: 2020-12-12 | Disposition: A | Discharge status: Home / Self Care | Payer: Medicare Other | Source: Home / Self Care

## 2020-12-12 ENCOUNTER — Other Ambulatory Visit: Payer: Self-pay

## 2020-12-12 DIAGNOSIS — Z20822 Contact with and (suspected) exposure to covid-19: Secondary | ICD-10-CM

## 2020-12-12 DIAGNOSIS — R0981 Nasal congestion: Secondary | ICD-10-CM

## 2020-12-12 DIAGNOSIS — R059 Cough, unspecified: Secondary | ICD-10-CM

## 2020-12-12 HISTORY — DX: Disorder of thyroid, unspecified: E07.9

## 2020-12-12 NOTE — Discharge Instructions (Signed)
  Follow up with family medicine as needed.   Call 911 or have someone drive you to the hospital if symptoms significantly worsening.

## 2020-12-12 NOTE — ED Triage Notes (Signed)
Pt presents to Urgent Care with c/o cough and congestion, which she reports is common for her d/t sinus issues. But her daughter-in-law tested positive for COVID yesterday, and pt was with her around Christmas; therefore, wants to be tested for COVID. Pt has not been vaccinated.

## 2020-12-12 NOTE — ED Provider Notes (Signed)
[facility]    CSN: [id number] Arrival date & time: 12/12/20  1200      History   Chief Complaint Chief Complaint  Patient presents with  . Cough  . Nasal Congestion    HPI Lauren Sandoval is a 69 y.o. female.   HPI Lauren Sandoval is a 69 y.o. female presenting to UC with c/o nasal congestion and mild cough, states she usually has this congestion from allergies but her daughter tested positive for COVID yesterday. Pt was around her daughter last weekend for Christmas.  Pt is not vaccinated for COVID. She visited her mother yesterday before she knew she was exposed to COVID and wants to make sure she does not have COVID.  Denies fever chills chest pain or SOB. No n/v/d.    Past Medical History:  Diagnosis Date  . Hypertension   . Thyroid disease     There are no problems to display for this patient.   Past Surgical History:  Procedure Laterality Date  . ABDOMINAL HYSTERECTOMY    . CHOLECYSTECTOMY    . ELBOW SURGERY      OB History   No obstetric history on file.      Home Medications    Prior to Admission medications   Medication Sig Start Date End Date Taking? Authorizing Provider  aspirin 81 MG chewable tablet Chew 81 mg by mouth daily.    [provider]  azithromycin (ZITHROMAX) 250 MG tablet Take 2 tabs PO x 1 dose, then 1 tab PO QD x 4 days 12/11/19   Elvina Sidle, MD  estradiol (ESTRACE) 0.5 MG tablet estradiol 0.5 mg tablet  TAKE 1 TABLET BY MOUTH EVERY DAY 07/30/17   [provider]  estrogens conjugated, synthetic A, (CENESTIN) 0.3 MG tablet Take 0.3 mg by mouth daily.    [provider]  fluticasone Aleda Grana) 50 MCG/ACT nasal spray 2 sprays into each nostril daily 05/12/12   Floydene Flock, MD  levothyroxine (SYNTHROID) 88 MCG tablet Take 88 mcg by mouth daily. 10/27/19   [provider]  loratadine (CLARITIN) 10 MG tablet Take 1 tablet (10 mg total) by mouth daily. 01/13/13   Floydene Flock, MD   montelukast (SINGULAIR) 10 MG tablet Take 10 mg by mouth at bedtime.    [provider]  valACYclovir (VALTREX) 1000 MG tablet Take 1 tablet (1,000 mg total) by mouth 3 (three) times daily. 01/19/14   Lattie Haw, MD  valsartan-hydrochlorothiazide (DIOVAN-HCT) 320-12.5 MG per tablet Take 1 tablet by mouth daily.    [provider]    Family History Family History  Problem Relation Age of Onset  . COPD Father   . Hyperlipidemia Mother   . Hypertension Mother   . Rheum arthritis Mother     Social History Social History   Tobacco Use  . Smoking status: Never Smoker  . Smokeless tobacco: Never Used  Vaping Use  . Vaping Use: Never used  Substance Use Topics  . Alcohol use: Yes    Comment: rarely  . Drug use: No     Allergies   Penicillins and Sulfa antibiotics   Review of Systems Review of Systems  Constitutional: Negative for chills and fever.  HENT: Positive for congestion. Negative for ear pain, sore throat, trouble swallowing and voice change.   Respiratory: Positive for cough. Negative for shortness of breath.   Cardiovascular: Negative for chest pain and palpitations.  Gastrointestinal: Negative for abdominal pain, diarrhea, nausea and vomiting.  Musculoskeletal: Negative for arthralgias, back pain and myalgias.  Skin: Negative for rash.  All other systems reviewed and are negative.    Physical Exam Triage Vital Signs ED Triage Vitals [12/12/20 1318]  Enc Vitals Group     BP (!) 164/89     Pulse Rate 83     Resp 18     Temp (!) 97.4 F (36.3 C)     Temp Source Oral     SpO2 97 %     Weight 164 lb (74.4 kg)     Height 5\' 4"  (1.626 m)     Head Circumference      Peak Flow      Pain Score 0     Pain Loc      Pain Edu?      Excl. in GC?    No data found.  Updated Vital Signs BP (!) 144/84 (BP Location: Right Arm)   Pulse 83   Temp (!) 97.4 F (36.3 C) (Oral)   Resp 18   Ht 5\' 4"  (1.626 m)   Wt 164 lb (74.4 kg)   SpO2 97%    BMI 28.15 kg/m   Visual Acuity Right Eye Distance:   Left Eye Distance:   Bilateral Distance:    Right Eye Near:   Left Eye Near:    Bilateral Near:     Physical Exam Vitals and nursing note reviewed.  Constitutional:      General: She is not in acute distress.    Appearance: Normal appearance. She is well-developed and well-nourished. She is not ill-appearing, toxic-appearing or diaphoretic.  HENT:     Head: Normocephalic and atraumatic.     Right Ear: Tympanic membrane and ear canal normal.     Left Ear: Tympanic membrane and ear canal normal.     Nose: Nose normal.     Right Sinus: No maxillary sinus tenderness or frontal sinus tenderness.     Left Sinus: No maxillary sinus tenderness or frontal sinus tenderness.     Mouth/Throat:     Lips: Pink.     Mouth: Mucous membranes are moist.     Pharynx: Oropharynx is clear. Uvula midline.  Eyes:     Extraocular Movements: EOM normal.  Cardiovascular:     Rate and Rhythm: Normal rate and regular rhythm.  Pulmonary:     Effort: Pulmonary effort is normal. No respiratory distress.     Breath sounds: Normal breath sounds. No stridor. No wheezing, rhonchi or rales.  Musculoskeletal:        General: Normal range of motion.     Cervical back: Normal range of motion and neck supple. No tenderness.  Lymphadenopathy:     Cervical: No cervical adenopathy.  Skin:    General: Skin is warm and dry.  Neurological:     Mental Status: She is alert and oriented to person, place, and time.  Psychiatric:        Mood and Affect: Mood and affect normal.        Behavior: Behavior normal.      UC Treatments / Results  Labs (all labs ordered are listed, but only abnormal results are displayed) Labs Reviewed  SARS-COV-2 RNA,(COVID-19) QUALITATIVE NAAT    EKG   Radiology No results found.  Procedures Procedures (including critical care time)  Medications Ordered in UC Medications - No data to display  Initial Impression /  Assessment and Plan / UC Course  I have reviewed the triage vital signs and the  nursing notes.  Pertinent labs & imaging results that were available during my care of the patient were reviewed by me and considered in my medical decision making (see chart for details).     No evidence of bacterial infection at this time. COVID/Flu pending Encouraged symptomatic tx F/u with PCP as needed AVS given  Final Clinical Impressions(s) / UC Diagnoses   Final diagnoses:  Close exposure to COVID-19 virus  Nasal congestion  Cough     Discharge Instructions      Follow up with family medicine as needed.   Call 911 or have someone drive you to the hospital if symptoms significantly worsening.     ED Prescriptions    None     PDMP not reviewed this encounter.   Lurene Shadow, [place] 12/12/20 1353

## 2020-12-14 LAB — SARS-COV-2 RNA,(COVID-19) QUALITATIVE NAAT: SARS CoV2 RNA: NOT DETECTED

## 2021-01-16 ENCOUNTER — Ambulatory Visit (INDEPENDENT_AMBULATORY_CARE_PROVIDER_SITE_OTHER): Payer: Medicare Other

## 2021-01-16 ENCOUNTER — Other Ambulatory Visit: Payer: Self-pay

## 2021-01-16 DIAGNOSIS — Z1231 Encounter for screening mammogram for malignant neoplasm of breast: Secondary | ICD-10-CM

## 2022-01-28 ENCOUNTER — Other Ambulatory Visit: Payer: Self-pay | Admitting: Family Medicine

## 2022-01-28 DIAGNOSIS — Z1231 Encounter for screening mammogram for malignant neoplasm of breast: Secondary | ICD-10-CM

## 2022-01-29 ENCOUNTER — Other Ambulatory Visit: Payer: Self-pay

## 2022-01-29 ENCOUNTER — Ambulatory Visit (INDEPENDENT_AMBULATORY_CARE_PROVIDER_SITE_OTHER): Payer: Medicare Other

## 2022-01-29 DIAGNOSIS — Z1231 Encounter for screening mammogram for malignant neoplasm of breast: Secondary | ICD-10-CM | POA: Diagnosis not present

## 2022-02-11 IMAGING — MG MM DIGITAL SCREENING BILAT W/ TOMO AND CAD
8 series · 8 of 24 positions shown · non-contrast
Comparison: Previous exam(s).

CLINICAL DATA: Screening.

EXAM:
DIGITAL SCREENING BILATERAL MAMMOGRAM WITH TOMOSYNTHESIS AND CAD

[R CC synth-2D]
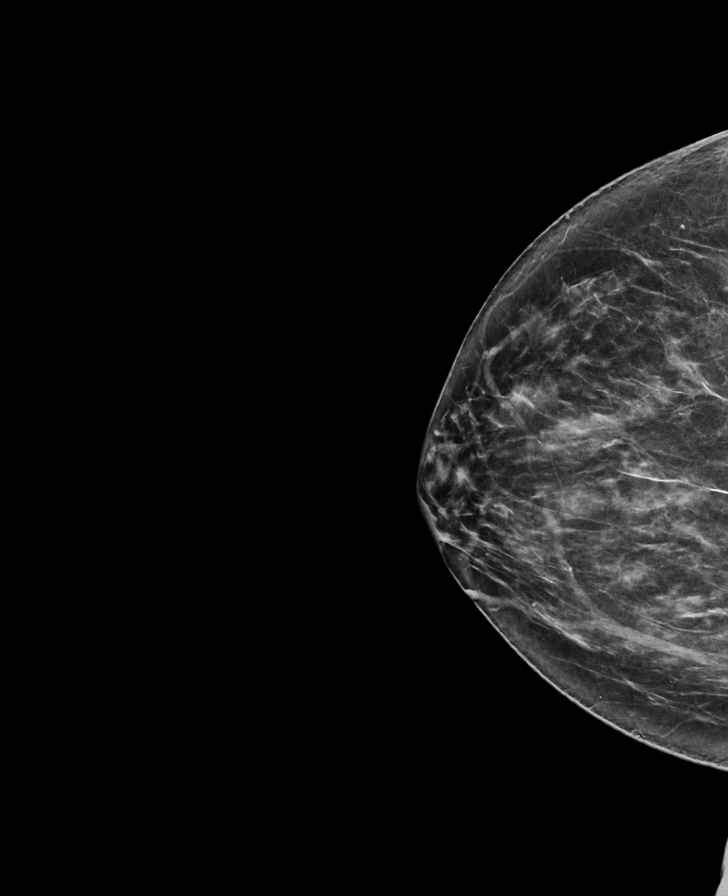

[R MLO synth-2D]
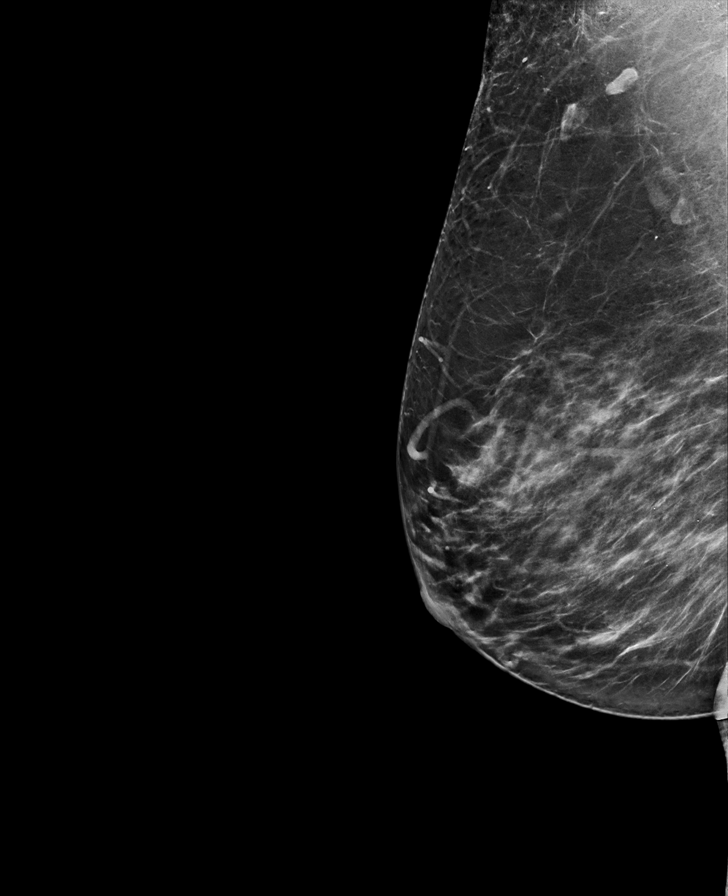

[L CC synth-2D]
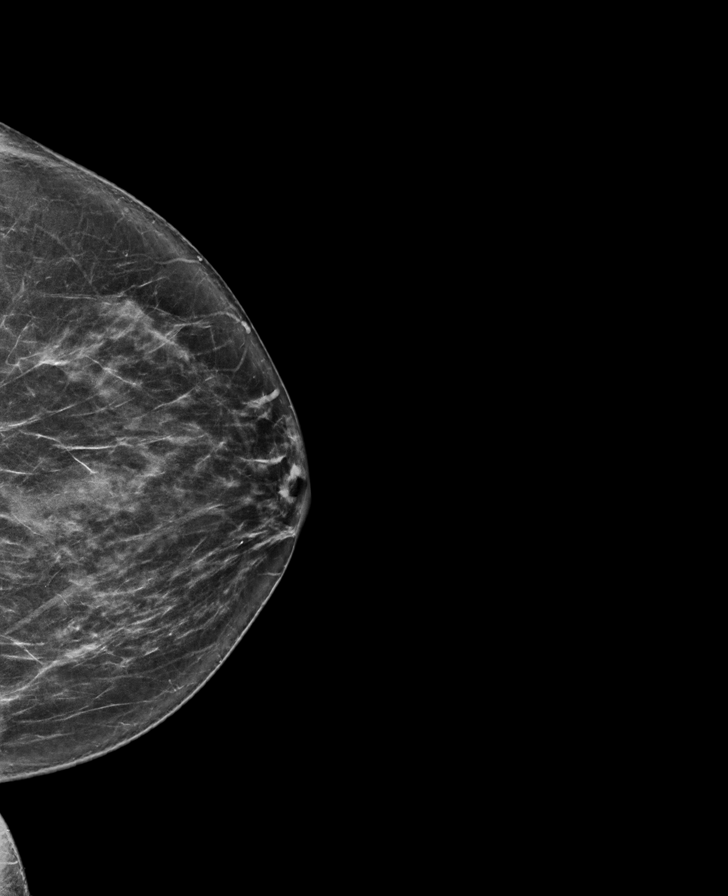

[L MLO synth-2D]
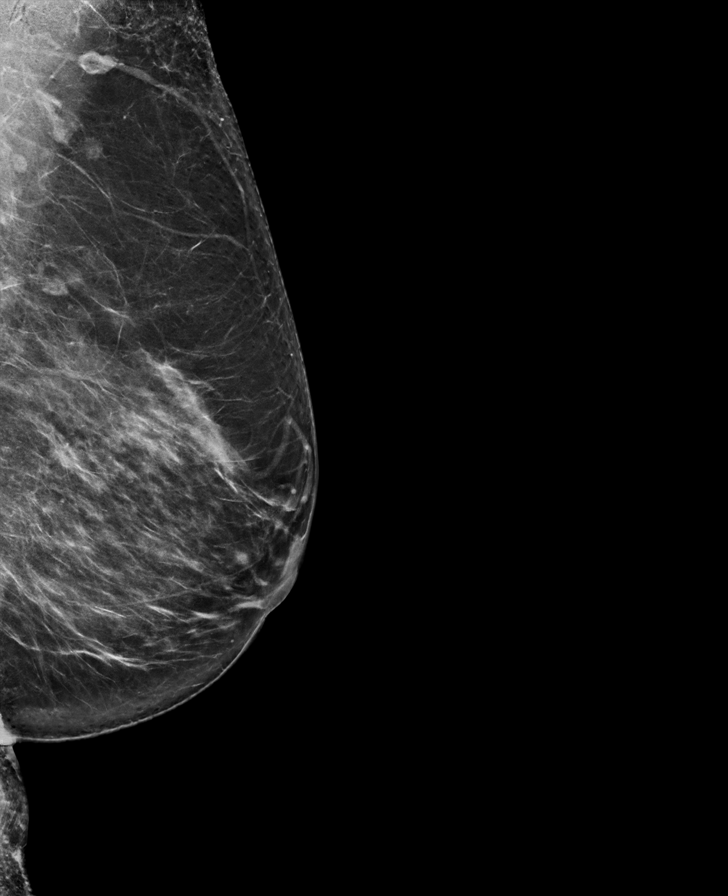

[L CC tomo · tomo slice 37/72.0]
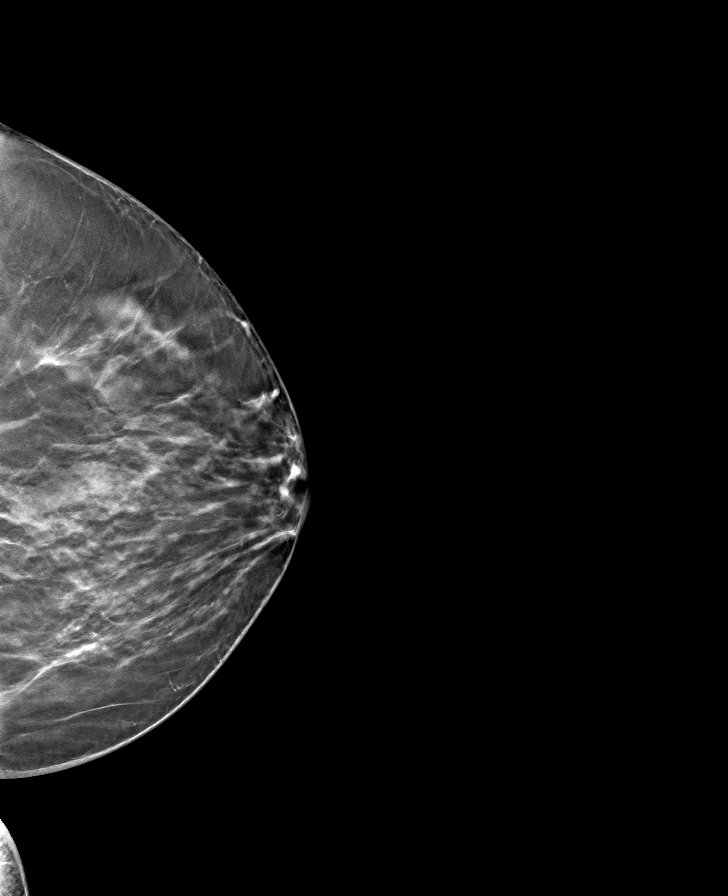

[L MLO tomo · tomo slice 41/80.0]
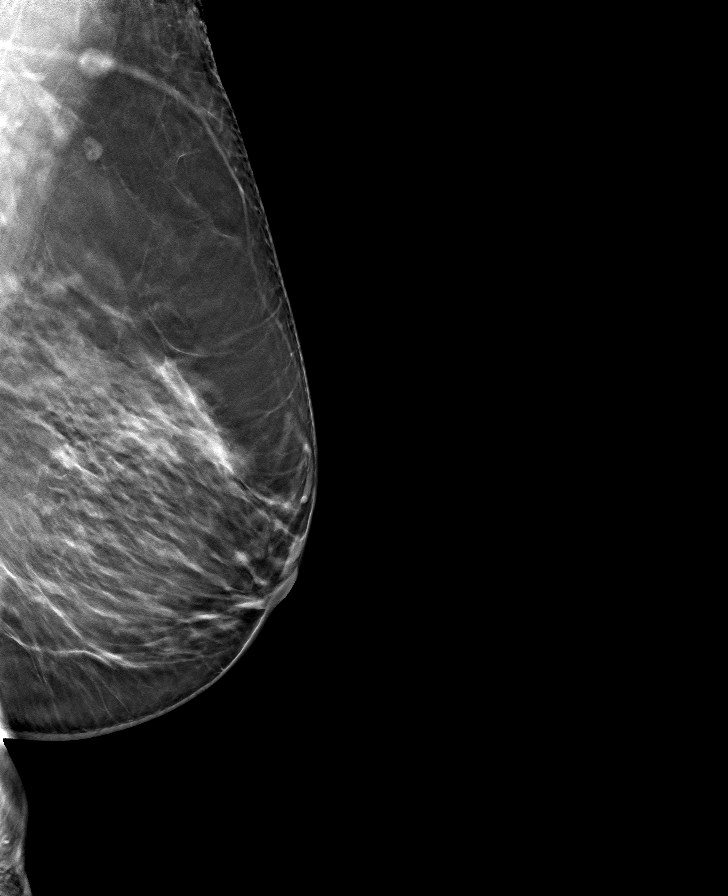

[R CC tomo · tomo slice 37/72.0]
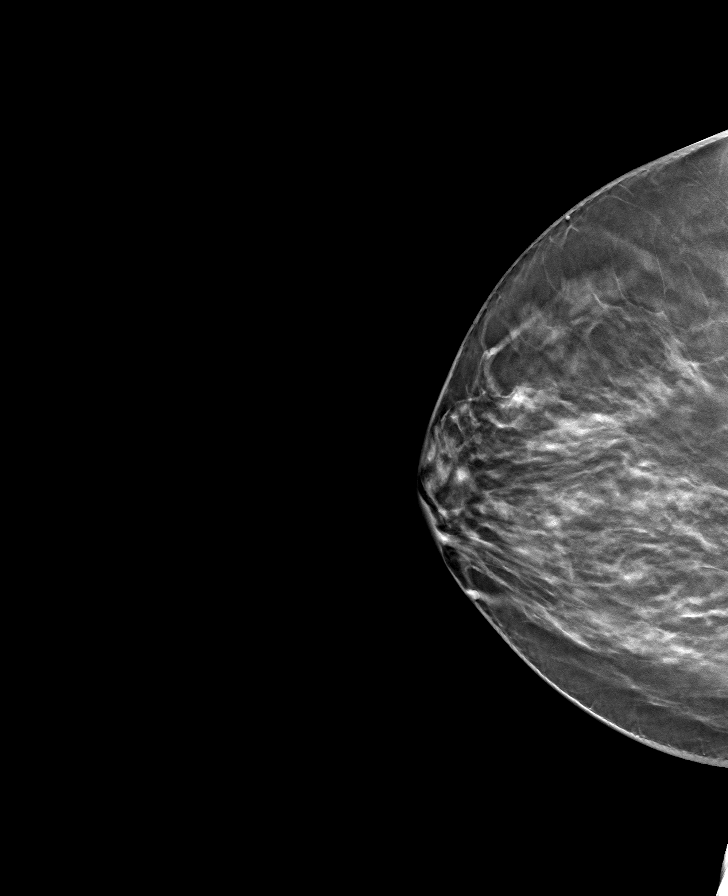

[R MLO tomo · tomo slice 39/78.0]
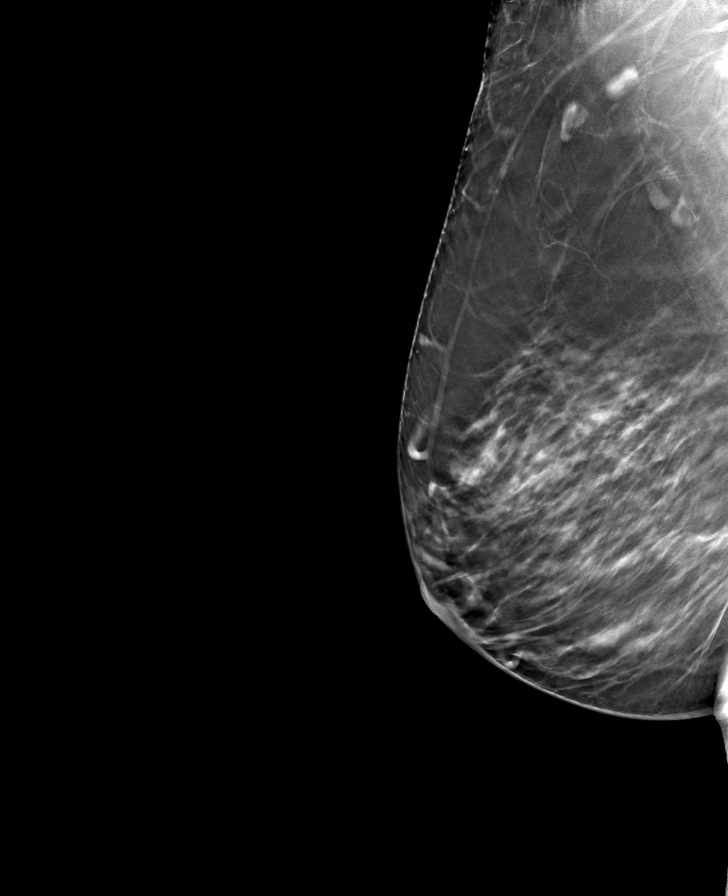

[8 of 24 positions shown; findings below may reference images not displayed]

ACR Breast Density Category c: The breast tissue is heterogeneously
dense, which may obscure small masses.
FINDINGS: There are no findings suspicious for malignancy. The images were
evaluated with computer-aided detection.
IMPRESSION: No mammographic evidence of malignancy. A result letter of this
screening mammogram will be mailed directly to the patient.

RECOMMENDATION:
Screening mammogram in one year. (Code:[id number]-[id number])

BI-RADS CATEGORY  1: Negative.

## 2023-01-21 ENCOUNTER — Other Ambulatory Visit: Payer: Self-pay | Admitting: Family Medicine

## 2023-01-21 DIAGNOSIS — Z1231 Encounter for screening mammogram for malignant neoplasm of breast: Secondary | ICD-10-CM

## 2023-02-17 ENCOUNTER — Ambulatory Visit (INDEPENDENT_AMBULATORY_CARE_PROVIDER_SITE_OTHER): Payer: Medicare Other

## 2023-02-17 DIAGNOSIS — Z1231 Encounter for screening mammogram for malignant neoplasm of breast: Secondary | ICD-10-CM | POA: Diagnosis not present

## 2023-04-30 ENCOUNTER — Encounter: Payer: Self-pay | Admitting: Emergency Medicine

## 2023-04-30 ENCOUNTER — Ambulatory Visit (INDEPENDENT_AMBULATORY_CARE_PROVIDER_SITE_OTHER): Payer: Medicare Other

## 2023-04-30 ENCOUNTER — Ambulatory Visit
Admission: EM | Admit: 2023-04-30 | Discharge: 2023-04-30 | Disposition: A | Discharge status: Home / Self Care | Payer: Medicare Other | Attending: Family Medicine | Admitting: Family Medicine

## 2023-04-30 DIAGNOSIS — M5412 Radiculopathy, cervical region: Secondary | ICD-10-CM | POA: Diagnosis not present

## 2023-04-30 DIAGNOSIS — M5010 Cervical disc disorder with radiculopathy, unspecified cervical region: Secondary | ICD-10-CM

## 2023-04-30 DIAGNOSIS — M25512 Pain in left shoulder: Secondary | ICD-10-CM

## 2023-04-30 DIAGNOSIS — M542 Cervicalgia: Secondary | ICD-10-CM

## 2023-04-30 DIAGNOSIS — M62830 Muscle spasm of back: Secondary | ICD-10-CM

## 2023-04-30 MED ORDER — PREDNISONE 10 MG (21) PO TBPK: ORAL_TABLET | Freq: Every day | ORAL | 0 refills | Status: AC

## 2023-04-30 MED ORDER — METHOCARBAMOL 500 MG PO TABS: 500.0000 mg | ORAL_TABLET | Freq: Three times a day (TID) | ORAL | 0 refills | Status: AC | PRN

## 2023-04-30 MED ORDER — CELECOXIB 100 MG PO CAPS: 100.0000 mg | ORAL_CAPSULE | Freq: Two times a day (BID) | ORAL | 0 refills | Status: AC

## 2023-04-30 NOTE — ED Provider Notes (Signed)
[facility]    CSN: [id number] Arrival date & time: 04/30/23  1019      History   Chief Complaint Chief Complaint  Patient presents with   Shoulder Pain    HPI Lauren Sandoval is a 72 y.o. female.   HPI Pleasant 72 year old female presents with left shoulder pain for 3 weeks.  Patient reports pain radiates across left shoulder through clavicle down through left hand.  Patient reports posterior neck pain for 3 weeks as well. Reports no apparent injury and pain worsened last night.  Patient reports taking OTC Tylenol arthritis with little to no relief.  PMH significant for HTN, GERD, and thyroid disease.  Past Medical History:  Diagnosis Date   Hypertension    Thyroid disease     There are no problems to display for this patient.   Past Surgical History:  Procedure Laterality Date   ABDOMINAL HYSTERECTOMY     CHOLECYSTECTOMY     ELBOW SURGERY      OB History   No obstetric history on file.      Home Medications    Prior to Admission medications   Medication Sig Start Date End Date Taking? Authorizing Provider  aspirin 81 MG chewable tablet Chew 81 mg by mouth daily.   Yes [provider]  azithromycin (ZITHROMAX) 250 MG tablet Take 2 tabs PO x 1 dose, then 1 tab PO QD x 4 days 12/11/19  Yes Elvina Sidle, MD  celecoxib (CELEBREX) 100 MG capsule Take 1 capsule (100 mg total) by mouth 2 (two) times daily for 15 days. 04/30/23 05/15/23 Yes Trevor Iha, FNP  estradiol (ESTRACE) 0.5 MG tablet estradiol 0.5 mg tablet  TAKE 1 TABLET BY MOUTH EVERY DAY 07/30/17  Yes [provider]  estrogens conjugated, synthetic A, (CENESTIN) 0.3 MG tablet Take 0.3 mg by mouth daily.   Yes [provider]  fluticasone Aleda Grana) 50 MCG/ACT nasal spray 2 sprays into each nostril daily 05/12/12  Yes Floydene Flock, MD  levothyroxine (SYNTHROID) 88 MCG tablet Take 88 mcg by mouth daily. 10/27/19  Yes [provider]  loratadine (CLARITIN) 10  MG tablet Take 1 tablet (10 mg total) by mouth daily. 01/13/13  Yes Floydene Flock, MD  methocarbamol (ROBAXIN) 500 MG tablet Take 1 tablet (500 mg total) by mouth 3 (three) times daily as needed. 04/30/23  Yes Trevor Iha, FNP  montelukast (SINGULAIR) 10 MG tablet Take 10 mg by mouth at bedtime.   Yes [provider]  predniSONE (STERAPRED UNI-PAK 21 TAB) 10 MG (21) TBPK tablet Take by mouth daily. Take 6 tabs by mouth daily  for 2 days, then 5 tabs for 2 days, then 4 tabs for 2 days, then 3 tabs for 2 days, 2 tabs for 2 days, then 1 tab by mouth daily for 2 days 04/30/23  Yes Trevor Iha, FNP  valACYclovir (VALTREX) 1000 MG tablet Take 1 tablet (1,000 mg total) by mouth 3 (three) times daily. 01/19/14  Yes Lattie Haw, MD  valsartan-hydrochlorothiazide (DIOVAN-HCT) 320-12.5 MG per tablet Take 1 tablet by mouth daily.   Yes [provider]    Family History Family History  Problem Relation Age of Onset   COPD Father    Hyperlipidemia Mother    Hypertension Mother    Rheum arthritis Mother     Social History Social History   Tobacco Use   Smoking status: Never   Smokeless tobacco: Never  Vaping Use   Vaping Use: Never  used  Substance Use Topics   Alcohol use: Yes    Comment: rarely   Drug use: No     Allergies   Penicillins and Sulfa antibiotics   Review of Systems Review of Systems  Musculoskeletal:  Positive for neck pain.       Left shoulder pain x 3 weeks  All other systems reviewed and are negative.    Physical Exam Triage Vital Signs ED Triage Vitals  Enc Vitals Group     BP      Pulse      Resp      Temp      Temp src      SpO2      Weight      Height      Head Circumference      Peak Flow      Pain Score      Pain Loc      Pain Edu?      Excl. in GC?    No data found.  Updated Vital Signs BP 133/85 (BP Location: Right Arm)   Pulse 81   Temp 98.2 F (36.8 C) (Oral)   Resp 16   Ht 5\' 4"  (1.626 m)   Wt 170 lb (77.1  kg)   SpO2 95%   BMI 29.18 kg/m      Physical Exam Vitals and nursing note reviewed.  Constitutional:      Appearance: Normal appearance. She is normal weight.  HENT:     Head: Normocephalic and atraumatic.     Mouth/Throat:     Mouth: Mucous membranes are moist.     Pharynx: Oropharynx is clear.  Eyes:     Extraocular Movements: Extraocular movements intact.     Conjunctiva/sclera: Conjunctivae normal.     Pupils: Pupils are equal, round, and reactive to light.  Cardiovascular:     Rate and Rhythm: Normal rate and regular rhythm.     Pulses: Normal pulses.     Heart sounds: Normal heart sounds.  Pulmonary:     Effort: Pulmonary effort is normal.     Breath sounds: Normal breath sounds. No wheezing, rhonchi or rales.  Musculoskeletal:        General: Normal range of motion.     Cervical back: Normal range of motion and neck supple.     Comments: Palpable bilateral posterior trapezius muscle adhesions noted; left shoulder limited range of motion with forward flexion/extension/internal/external rotation/horizontal abduction/adduction, and external rotation  Skin:    General: Skin is warm and dry.  Neurological:     General: No focal deficit present.     Mental Status: She is alert and oriented to person, place, and time. Mental status is at baseline.  Psychiatric:        Mood and Affect: Mood normal.        Behavior: Behavior normal.      UC Treatments / Results  Labs (all labs ordered are listed, but only abnormal results are displayed) Labs Reviewed - No data to display  EKG   Radiology DG Cervical Spine Complete  Result Date: 04/30/2023 CLINICAL DATA:  Bilateral posterior neck pain radiating to left shoulder x 3 weeks EXAM: CERVICAL SPINE - COMPLETE 4+ VIEW COMPARISON:  None Available. FINDINGS: The cervical spine is visualized from C1-C7. Straightening of the cervical lordosis. Vertebral body heights are maintained: no evidence of acute fracture. Moderate to  severe intervertebral disc space height loss at C5-6 and moderate intervertebral disc space height loss  at C6-7. Osseous neuroforaminal narrowing of the LEFT greater than RIGHT lower cervical spine at C5-6 and C6-7 secondary to facet arthropathy and uncovertebral hypertrophy. No prevertebral soft tissue swelling. Visualized thorax is unremarkable. IMPRESSION: Moderate to severe degenerative changes of the lower cervical spine most pronounced at C5-6 and C6-7. Electronically Signed   By: Meda Klinefelter M.D.   On: 04/30/2023 11:56   DG Shoulder Left  Result Date: 04/30/2023 CLINICAL DATA:  Bilateral posterior neck pain radiating to left shoulder x 3 weeks EXAM: LEFT SHOULDER - 2+ VIEW COMPARISON:  August 03, 2006. FINDINGS: No acute fracture or dislocation. Minimal degenerative changes of the glenohumeral joint and acromioclavicular joint. No area of erosion or osseous destruction. No unexpected radiopaque foreign body. Prominent cardiomediastinal silhouette. IMPRESSION: 1. Minimal degenerative changes of the glenohumeral and acromioclavicular joints. 2. Prominent cardiomediastinal silhouette, incompletely visualized. Electronically Signed   By: Meda Klinefelter M.D.   On: 04/30/2023 11:54    Procedures Procedures (including critical care time)  Medications Ordered in UC Medications - No data to display  Initial Impression / Assessment and Plan / UC Course  I have reviewed the triage vital signs and the nursing notes.  Pertinent labs & imaging results that were available during my care of the patient were reviewed by me and considered in my medical decision making (see chart for details).     MDM: 1.  Cervical disc disorder with radiculopathy of cervical region-C-spine x-ray revealed above, Rx'd Sterapred Unipak tapering from 60 mg to 10 mg over 10 days; 2.  Bilateral posterior neck pain-C-spine x-ray revealed above; 3.  Acute pain of left shoulder-left shoulder x-ray revealed above, Rx'd  Celebrex 100 mg twice daily x 15 days; 4.  Spasm of both trapezius muscles-Rx'd Skelaxin 800 mg 3 times daily, as needed. Advised patient of shoulder and neck x-ray results with hardcopy and images provided to patient advised patient to take medication as directed with food to completion.  Advised patient to take prednisone with first dose of Celebrex for the next 10 to 15 days.  Advised may use Skelaxin daily or as needed for accompanying trapezius spasms.  Encouraged increase daily water intake to 64 ounces per day while taking these medications.  Advised if symptoms worsen and/or unresolved please follow-up with PCP, [facility] orthopedic provider, or here for further evaluation.  Final Clinical Impressions(s) / UC Diagnoses   Final diagnoses:  Acute pain of left shoulder  Bilateral posterior neck pain  Cervical disc disorder with radiculopathy of cervical region  Spasm of both trapezius muscles     Discharge Instructions      Advised patient of shoulder and neck x-ray results with hardcopy and images provided to patient advised patient to take medication as directed with food to completion.  Advised patient to take prednisone with first dose of Celebrex for the next 10 to 15 days.  Advised may use Skelaxin daily or as needed for accompanying trapezius spasms.  Encouraged increase daily water intake to 64 ounces per day while taking these medications.  Advised if symptoms worsen and/or unresolved please follow-up with PCP, [facility] orth[facility] provider, or here for further evaluation.     ED Prescriptions     Medication Sig Dispense Auth. Provider   celecoxib (CELEBREX) 100 MG capsule Take 1 capsule (100 mg total) by mouth 2 (two) times daily for 15 days. 30 capsule Trevor Iha, FNP   predniSONE (STERAPRED UNI-PAK 21 TAB) 10 MG (21) TBPK tablet Take by mouth daily. Take  6 tabs by mouth daily  for 2 days, then 5 tabs for 2 days, then 4 tabs for 2 days, then 3 tabs for 2 days, 2 tabs  for 2 days, then 1 tab by mouth daily for 2 days 42 tablet Trevor Iha, FNP   methocarbamol (ROBAXIN) 500 MG tablet Take 1 tablet (500 mg total) by mouth 3 (three) times daily as needed. 30 tablet Trevor Iha, FNP      PDMP not reviewed this encounter.   Trevor Iha, FNP 04/30/23 1310

## 2023-04-30 NOTE — ED Triage Notes (Signed)
Patient c/o left shoulder, clavicle pain that radiates down left arm into hand x 3 weeks.  No injury.  The pain worsened last night.  Patient has taken Tylenol Arthritis.

## 2023-04-30 NOTE — Discharge Instructions (Addendum)
Advised patient of shoulder and neck x-ray results with hardcopy and images provided to patient advised patient to take medication as directed with food to completion.  Advised patient to take prednisone with first dose of Celebrex for the next 10 to 15 days.  Advised may use Skelaxin daily or as needed for accompanying trapezius spasms.  Encouraged increase daily water intake to 64 ounces per day while taking these medications.  Advised if symptoms worsen and/or unresolved please follow-up with PCP, [facility] provider, or here for further evaluation.

## 2024-02-23 ENCOUNTER — Other Ambulatory Visit: Payer: Self-pay | Admitting: Family Medicine

## 2024-02-23 ENCOUNTER — Ambulatory Visit

## 2024-02-23 DIAGNOSIS — Z1231 Encounter for screening mammogram for malignant neoplasm of breast: Secondary | ICD-10-CM | POA: Diagnosis not present

## 2024-06-30 ENCOUNTER — Encounter: Payer: Self-pay | Admitting: Advanced Practice Midwife
